# Patient Record
Sex: Female | Born: 1985 | Hispanic: No | Marital: Married | Smoking: Never smoker
Health system: Southern US, Community
[De-identification: ages and names within clinical notes are randomized; demographics above are authoritative.]

## PROBLEM LIST (undated history)

## (undated) DIAGNOSIS — O24419 Gestational diabetes mellitus in pregnancy, unspecified control: Secondary | ICD-10-CM

## (undated) HISTORY — DX: Gestational diabetes mellitus in pregnancy, unspecified control: O24.419

---

## 2008-12-09 ENCOUNTER — Ambulatory Visit (HOSPITAL_COMMUNITY): Admission: RE | Admit: 2008-12-09 | Discharge: 2008-12-09 | Payer: Self-pay | Admitting: Obstetrics and Gynecology

## 2009-01-20 ENCOUNTER — Ambulatory Visit (HOSPITAL_COMMUNITY): Admission: RE | Admit: 2009-01-20 | Discharge: 2009-01-20 | Payer: Self-pay | Admitting: Obstetrics and Gynecology

## 2009-02-17 ENCOUNTER — Ambulatory Visit (HOSPITAL_COMMUNITY): Admission: RE | Admit: 2009-02-17 | Discharge: 2009-02-17 | Payer: Self-pay | Admitting: *Deleted

## 2009-03-18 ENCOUNTER — Ambulatory Visit (HOSPITAL_COMMUNITY): Admission: RE | Admit: 2009-03-18 | Discharge: 2009-03-18 | Payer: Self-pay | Admitting: Obstetrics and Gynecology

## 2010-06-15 IMAGING — US US OB FOLLOW-UP
1 series · 14 of 24 positions shown · non-contrast
Comparison: none

OBSTETRICAL ULTRASOUND:
 This ultrasound was performed in The [HOSPITAL], and the [facility] report will be stored to [REDACTED] PACS.

[Series 1: us ob follow-up · 14 of 24 slices shown]
[im 1/24]
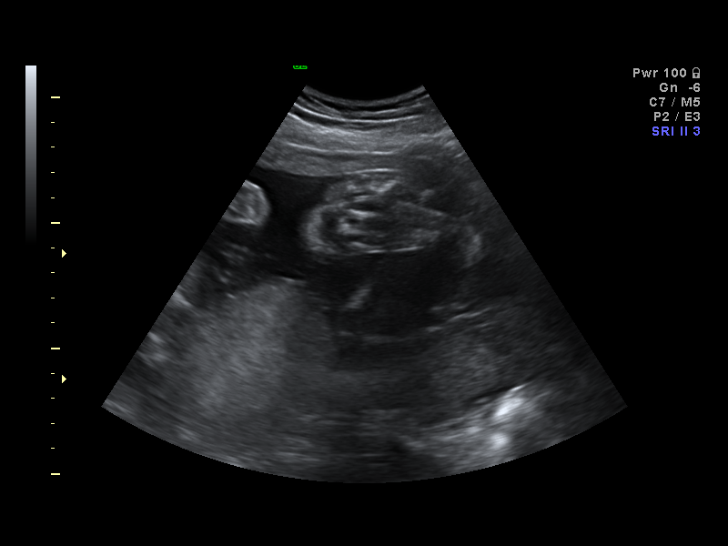
[im 3/24]
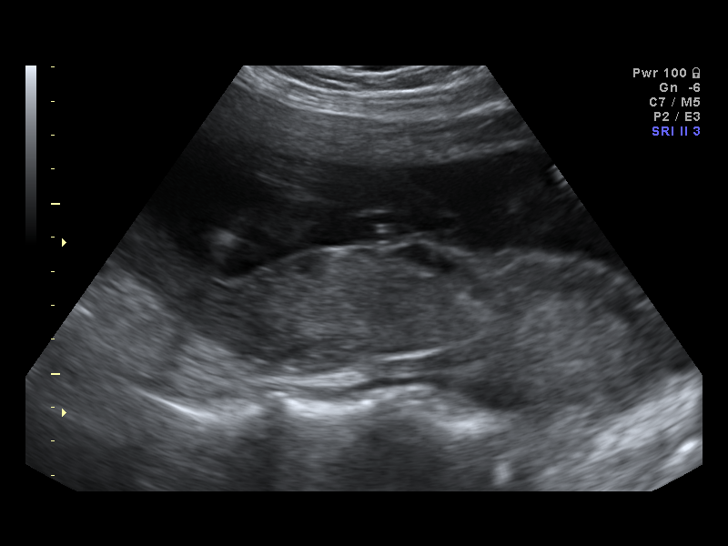
[im 5/24]
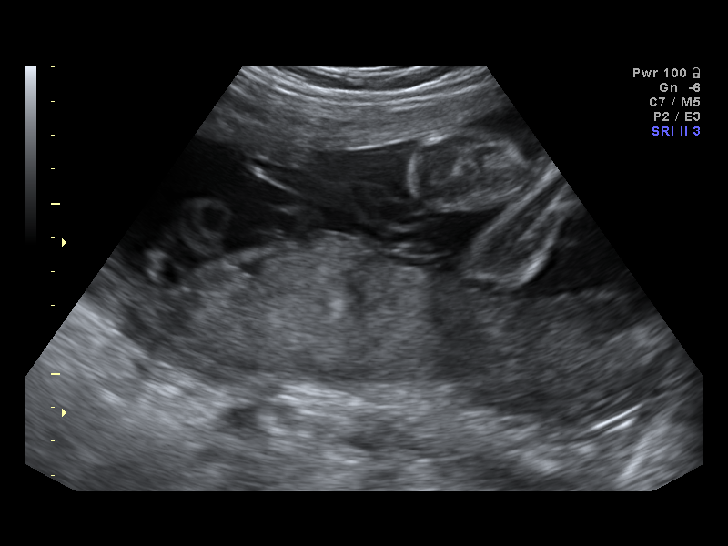
[im 7/24]
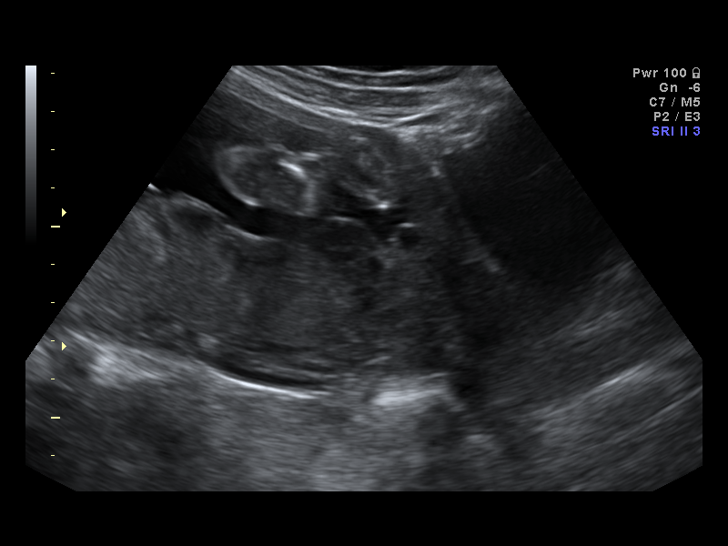
[im 8/24]
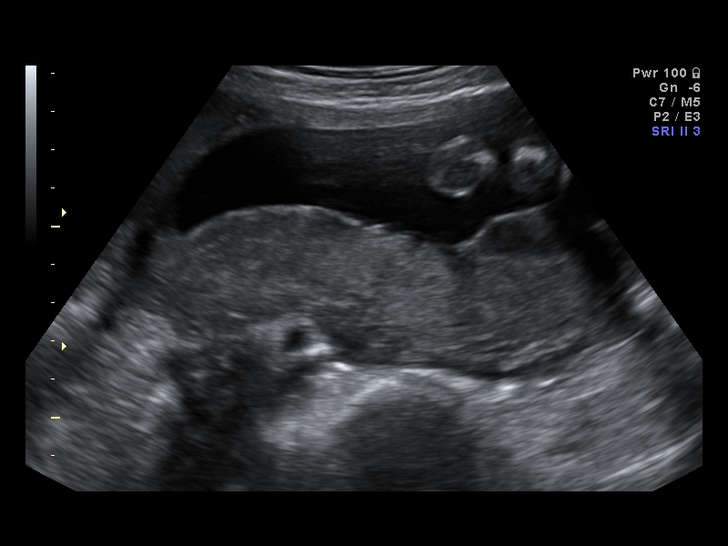
[im 10/24]
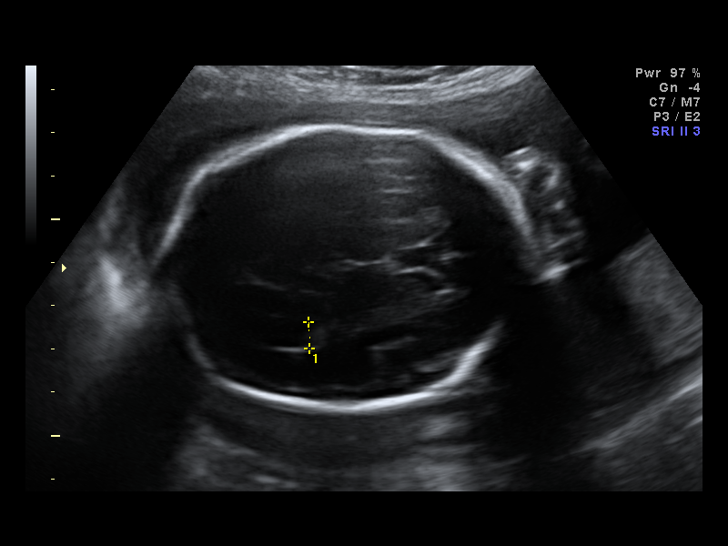
[im 12/24]
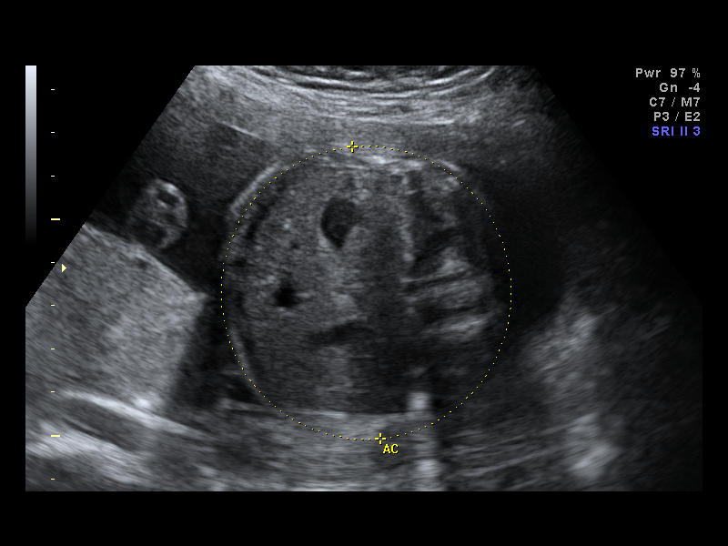
[im 13/24]
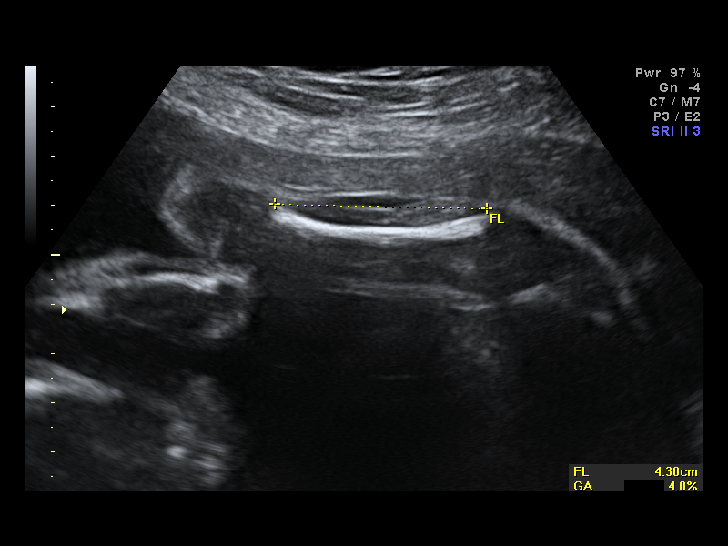
[im 15/24]
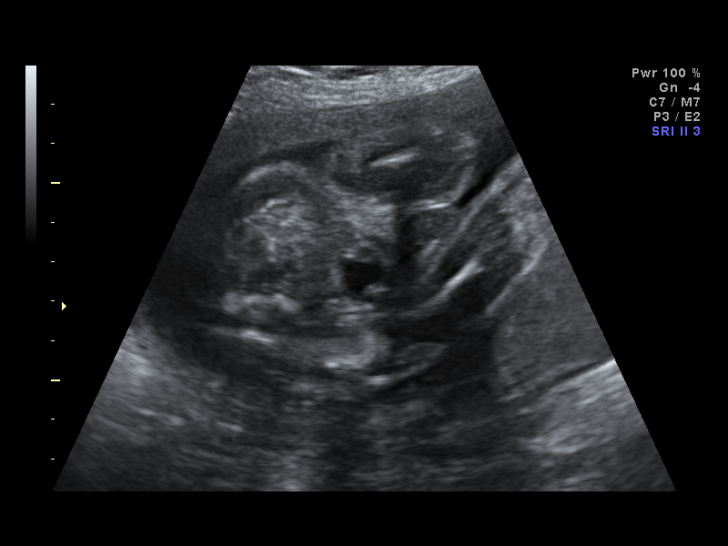
[im 17/24]
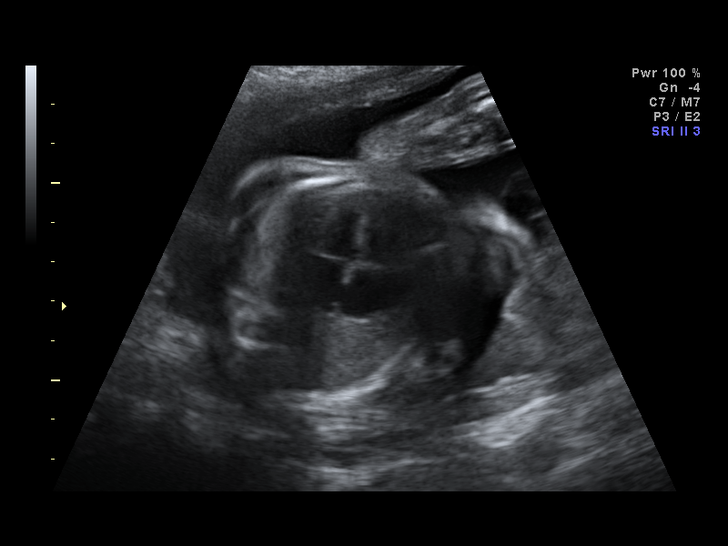
[im 19/24]
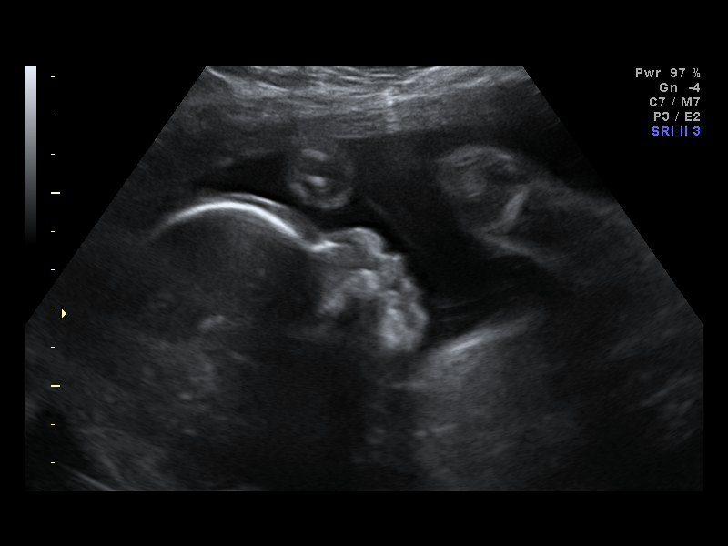
[im 20/24]
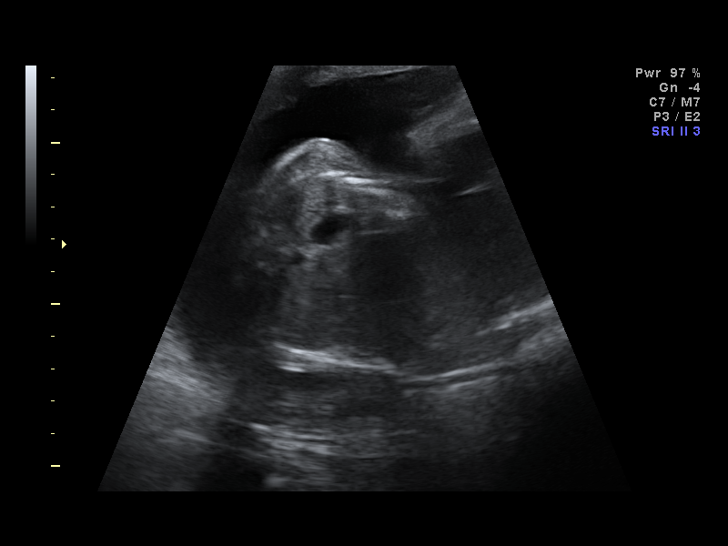
[im 22/24]
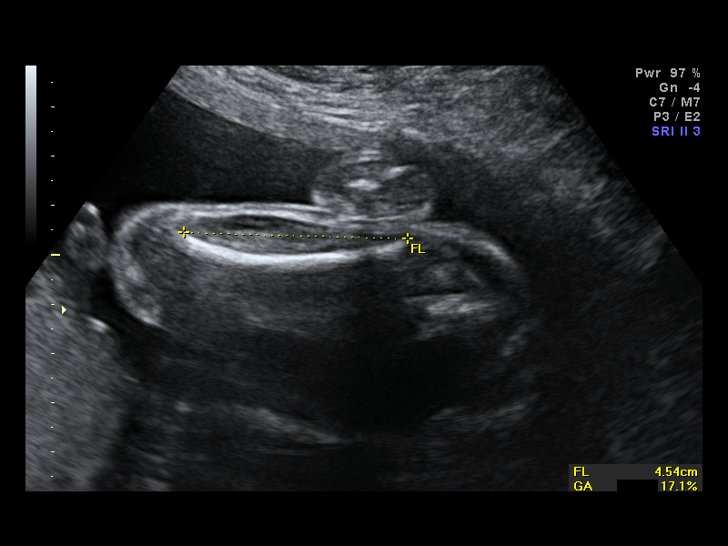
[im 24/24]
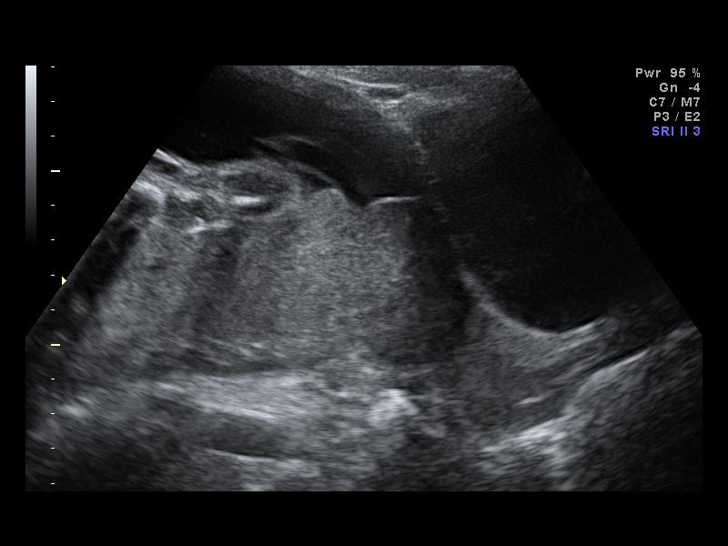

[14 of 24 positions shown; findings below may reference images not displayed]

IMPRESSION: [facility] has also been faxed to the ordering physician.

## 2010-08-11 IMAGING — US US OB FOLLOW-UP
1 series · 14 of 28 positions shown · non-contrast
Comparison: none

OBSTETRICAL ULTRASOUND:
 This ultrasound was performed in The [HOSPITAL], and the [facility] report will be stored to [REDACTED] PACS.

[Series 1: us ob follow-up · 14 of 30 slices shown]
[im 2/30]
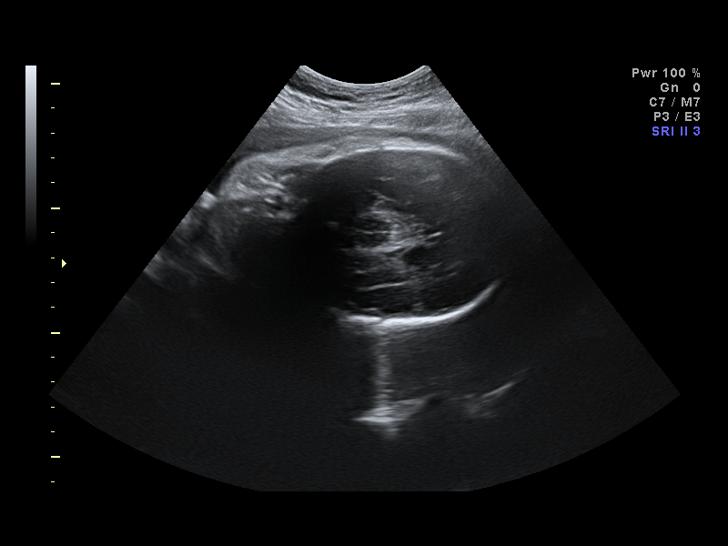
[im 4/30]
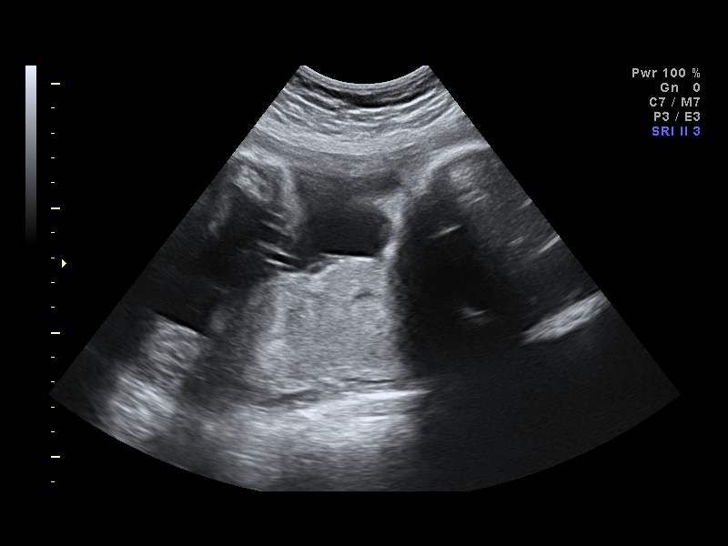
[im 6/30]
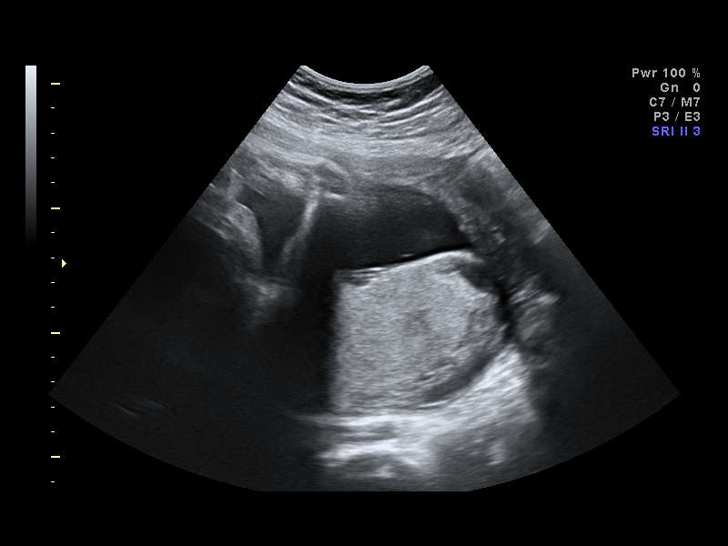
[im 8/30]
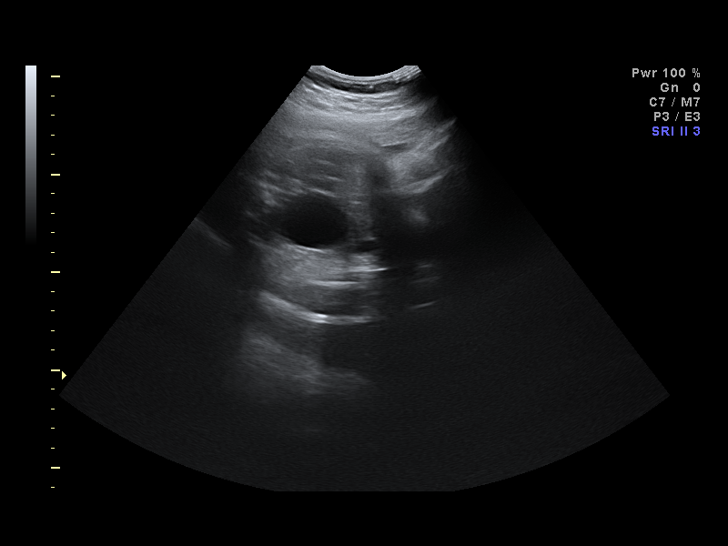
[im 10/30]
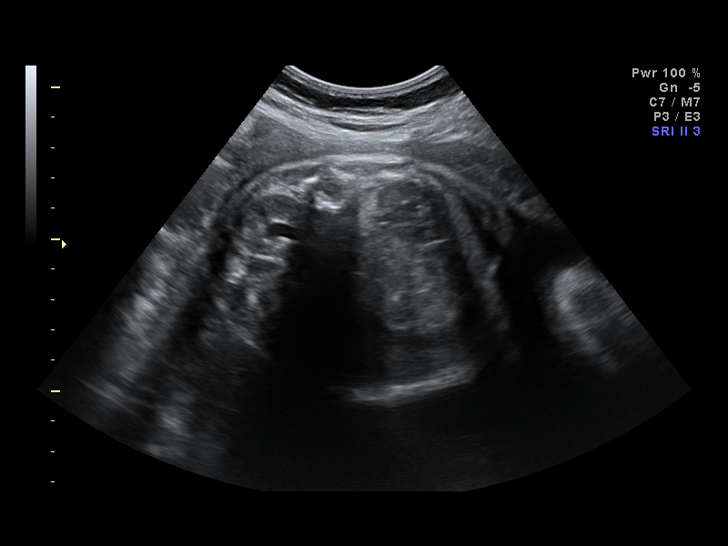
[im 12/30]
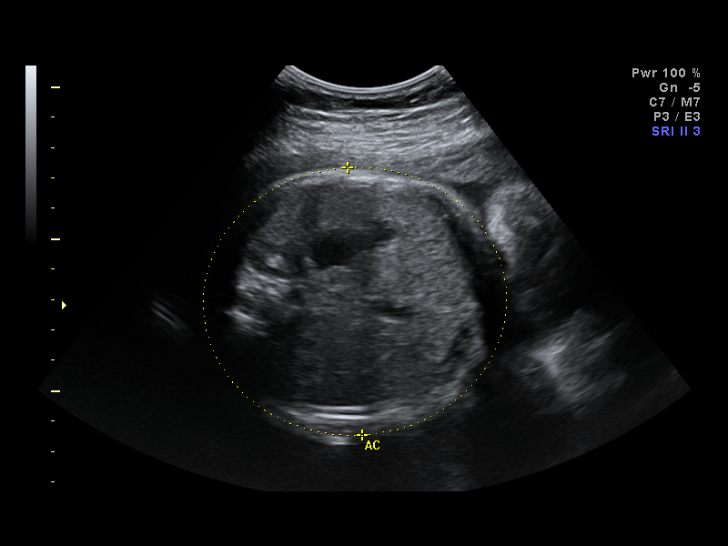
[im 14/30]
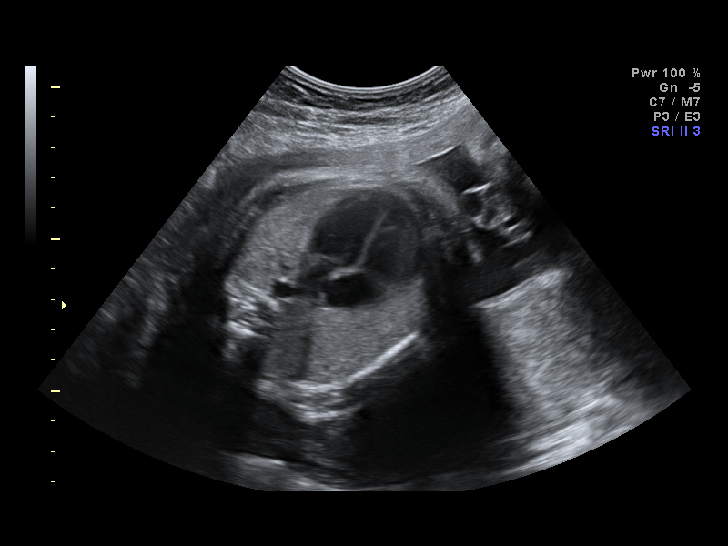
[im 17/30]
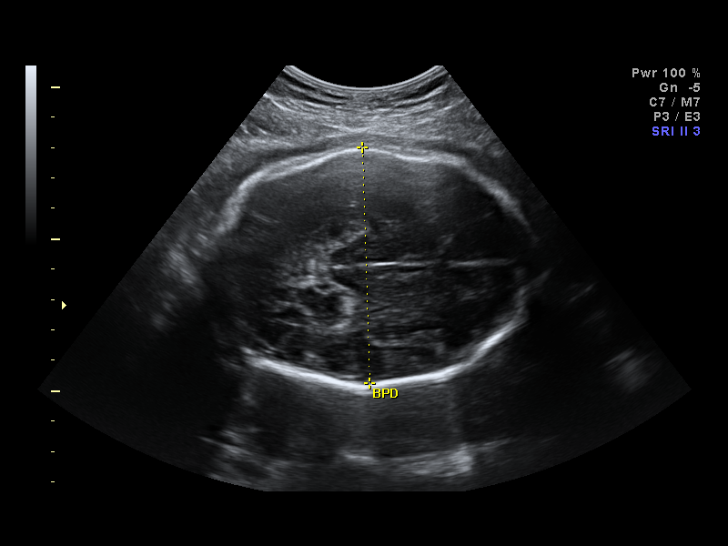
[im 19/30]
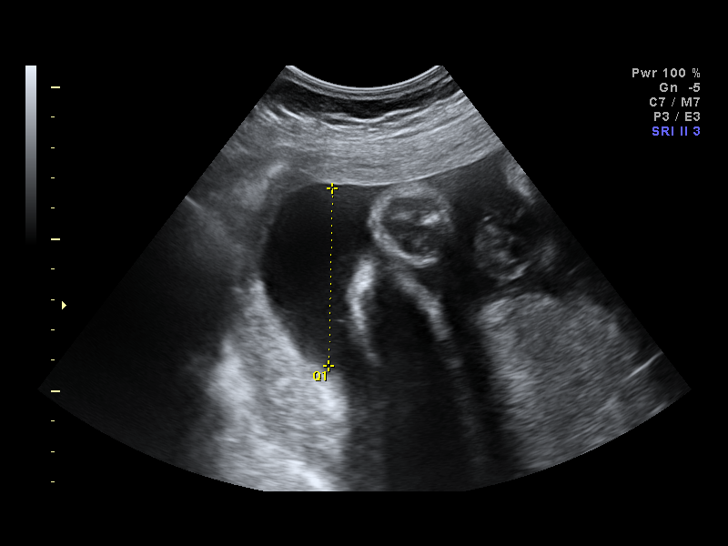
[im 21/30]
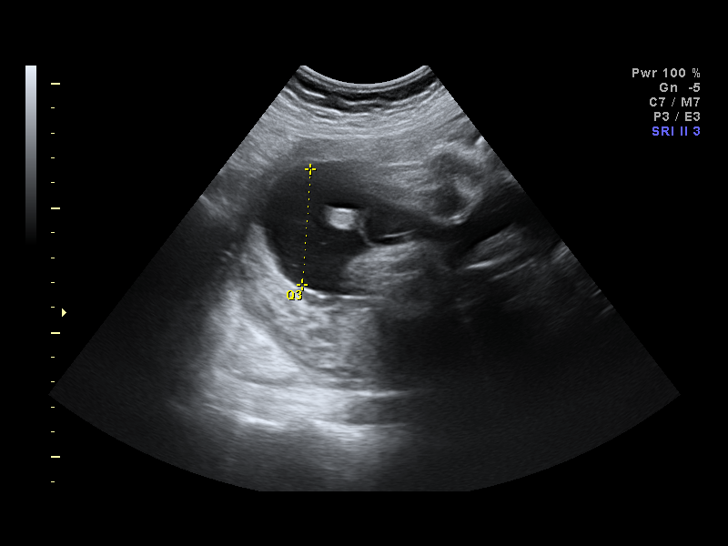
[im 23/30]
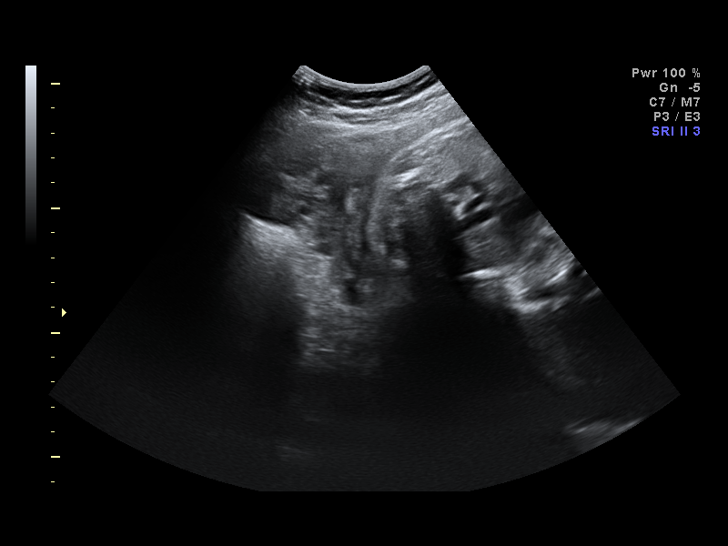
[im 25/30]
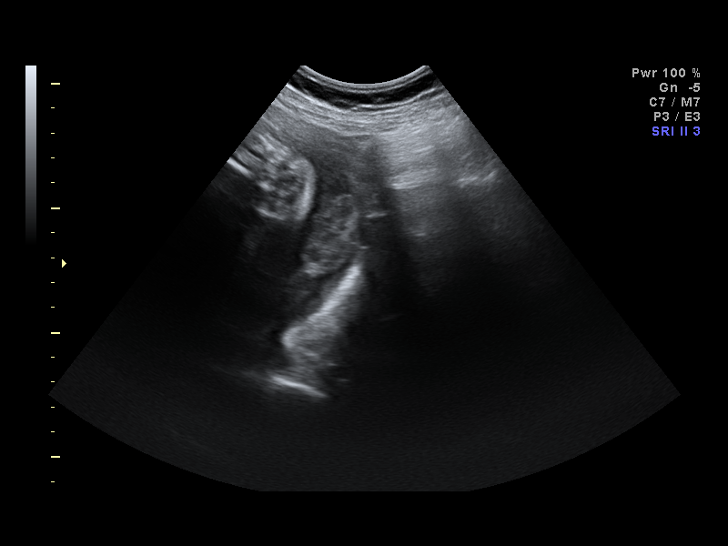
[im 27/30]
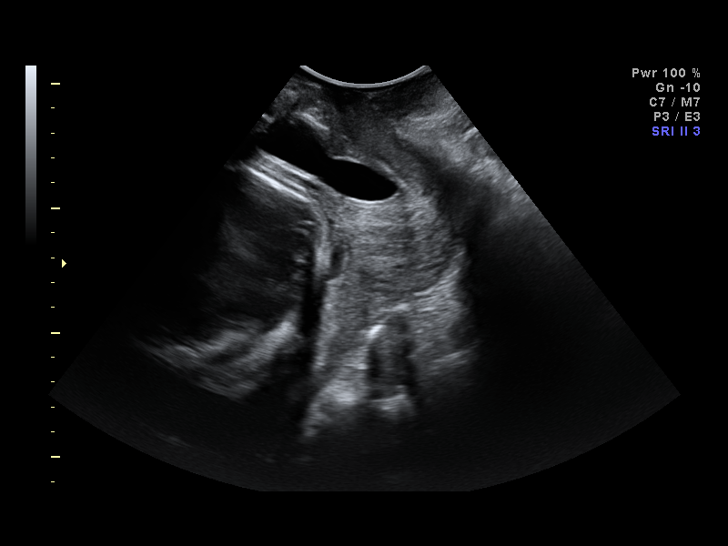
[im 30/30]
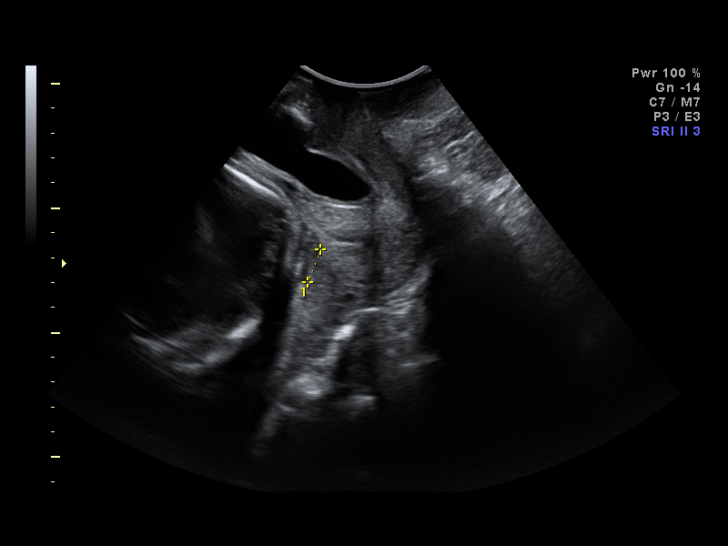

[14 of 28 positions shown; findings below may reference images not displayed]

IMPRESSION: [facility] has also been faxed to the ordering physician.

## 2010-10-05 ENCOUNTER — Encounter: Payer: Self-pay | Admitting: Obstetrics and Gynecology

## 2012-09-13 NOTE — L&D Delivery Note (Signed)
Delivery Note At 11:14 AM a viable female was delivered via VBAC, Spontaneous (Presentation: Middle Occiput Anterior).  APGAR: 5, 9; weight .   Placenta status: Intact, Spontaneous.  Cord: 3 vessels with the following complications: None.   Precipitous delivery. Head delivering on my arrival assisted by Carollee Herter, RN.   Anesthesia:1% Xylocaine local infiltration and Fentanyl 300 mcg IVP for repair.  Episiotomy: None Lacerations: 3rd degree;Perineal Suture Repair: 2.0 3.0 vicryl rapide Est. Blood Loss (mL): 400 CBGs 78-90  Mom to postpartum.  Baby to Couplet care / Skin to Skin.  Balinda Heacock 07/24/2013, 12:06 PM

## 2012-09-13 NOTE — L&D Delivery Note (Signed)
Attestation of Attending Supervision of Advanced Practitioner (PA/CNM/NP): Evaluation and management procedures were performed by the Advanced Practitioner under my supervision and collaboration.  I have reviewed the Advanced Practitioner's note and chart, and I agree with the management and plan.  Evaan Tidwell, MD, FACOG Attending Obstetrician & Gynecologist Faculty Practice, [facility]  

## 2013-02-26 LAB — OB RESULTS CONSOLE RPR
RPR: NONREACTIVE
RPR: NONREACTIVE

## 2013-02-26 LAB — OB RESULTS CONSOLE GC/CHLAMYDIA
Chlamydia: NEGATIVE
Gonorrhea: NEGATIVE

## 2013-02-26 LAB — OB RESULTS CONSOLE ABO/RH: "RH Type ": POSITIVE

## 2013-02-26 LAB — OB RESULTS CONSOLE HEPATITIS B SURFACE ANTIGEN: Hepatitis B Surface Ag: NEGATIVE

## 2013-02-26 LAB — OB RESULTS CONSOLE RUBELLA ANTIBODY, IGM: Rubella: IMMUNE

## 2013-02-26 LAB — OB RESULTS CONSOLE HIV ANTIBODY (ROUTINE TESTING)
HIV: NONREACTIVE
HIV: NONREACTIVE

## 2013-02-26 LAB — OB RESULTS CONSOLE ANTIBODY SCREEN: Antibody Screen: NEGATIVE

## 2013-05-16 ENCOUNTER — Encounter: Payer: Self-pay | Admitting: *Deleted

## 2013-05-21 ENCOUNTER — Encounter: Payer: Medicaid Other | Attending: Family Medicine | Admitting: *Deleted

## 2013-05-21 ENCOUNTER — Ambulatory Visit (INDEPENDENT_AMBULATORY_CARE_PROVIDER_SITE_OTHER): Payer: Medicaid Other | Admitting: Family Medicine

## 2013-05-21 ENCOUNTER — Encounter: Payer: Self-pay | Admitting: Family Medicine

## 2013-05-21 VITALS — BP 116/77 | Temp 97.8°F | Ht 59.0 in | Wt 165.7 lb

## 2013-05-21 DIAGNOSIS — O99810 Abnormal glucose complicating pregnancy: Secondary | ICD-10-CM | POA: Insufficient documentation

## 2013-05-21 DIAGNOSIS — O34219 Maternal care for unspecified type scar from previous cesarean delivery: Secondary | ICD-10-CM | POA: Insufficient documentation

## 2013-05-21 DIAGNOSIS — O24419 Gestational diabetes mellitus in pregnancy, unspecified control: Secondary | ICD-10-CM | POA: Insufficient documentation

## 2013-05-21 DIAGNOSIS — Z713 Dietary counseling and surveillance: Secondary | ICD-10-CM | POA: Insufficient documentation

## 2013-05-21 DIAGNOSIS — Z23 Encounter for immunization: Secondary | ICD-10-CM

## 2013-05-21 DIAGNOSIS — O0990 Supervision of high risk pregnancy, unspecified, unspecified trimester: Secondary | ICD-10-CM

## 2013-05-21 LAB — POCT URINALYSIS DIP (DEVICE)
Ketones, ur: NEGATIVE mg/dL
Protein, ur: NEGATIVE mg/dL
Specific Gravity, Urine: 1.02 (ref 1.005–1.030)
Urobilinogen, UA: 0.2 mg/dL (ref 0.0–1.0)

## 2013-05-21 MED ORDER — TETANUS-DIPHTH-ACELL PERTUSSIS 5-2.5-18.5 LF-MCG/0.5 IM SUSP
0.5000 mL | Freq: Once | INTRAMUSCULAR | Status: AC
  Administered 2013-05-21: 0.5 mL via INTRAMUSCULAR

## 2013-05-21 NOTE — Progress Notes (Signed)
Pulse- 98 New ob packet given Weight gain of 11-20lbs

## 2013-05-21 NOTE — Patient Instructions (Addendum)
Following an appropriate diet and keeping your blood sugar under control is the most important thing to do for your health and that of your unborn baby.  Please check your blood sugar 4 times daily.  Please keep accurate BS logs and bring them with you to every visit.  Please bring your meter also.  Goals for Blood sugar should be: 1.  Fasting (first thing in the morning before eating) should be less than 90.   2.  2 hours after meals should be less than 120.  Please eat 3 meals and 3 snacks.  Include protein (meat, dairy-cheese, eggs, nuts) with all meals.  Be mindful that carbohydrates increase your blood sugar.  Not just sweet food (cookies, cake, donuts, fruit, juice, soda) but also bread, pasta, rice, and potatoes.  You have to limit how many carbs you are eating.  Adding exercise, as little as 30 minutes a day can decrease your blood sugar.  Gestational Diabetes Mellitus Gestational diabetes mellitus, often simply referred to as gestational diabetes, is a type of diabetes that some women develop during pregnancy. In gestational diabetes, the pancreas does not make enough insulin (a hormone), the cells are less responsive to the insulin that is made (insulin resistance), or both.Normally, insulin moves sugars from food into the tissue cells. The tissue cells use the sugars for energy. The lack of insulin or the lack of normal response to insulin causes excess sugars to build up in the blood instead of going into the tissue cells. As a result, high blood sugar (hyperglycemia) develops. The effect of high sugar (glucose) levels can cause many complications.  RISK FACTORS You have an increased chance of developing gestational diabetes if you have a family history of diabetes and also have one or more of the following risk factors:  A body mass index over 30 (obesity).  A previous pregnancy with gestational diabetes.  An older age at the time of pregnancy. If blood glucose levels are kept  in the normal range during pregnancy, women can have a healthy pregnancy. If your blood glucose levels are not well controlled, there may be risks to you, your unborn baby (fetus), your labor and delivery, or your newborn baby.  SYMPTOMS  If symptoms are experienced, they are much like symptoms you would normally expect during pregnancy. The symptoms of gestational diabetes include:   Increased thirst (polydipsia).  Increased urination (polyuria).  Increased urination during the night (nocturia).  Weight loss. This weight loss may be rapid.  Frequent, recurring infections.  Tiredness (fatigue).  Weakness.  Vision changes, such as blurred vision.  Fruity smell to your breath.  Abdominal pain. DIAGNOSIS Diabetes is diagnosed when blood glucose levels are increased. Your blood glucose level may be checked by one or more of the following blood tests:  A fasting blood glucose test. You will not be allowed to eat for at least 8 hours before a blood sample is taken.  A random blood glucose test. Your blood glucose is checked at any time of the day regardless of when you ate.  A hemoglobin A1c blood glucose test. A hemoglobin A1c test provides information about blood glucose control over the previous 3 months.  An oral glucose tolerance test (OGTT). Your blood glucose is measured after you have not eaten (fasted) for 1 3 hours and then after you drink a glucose-containing beverage. Since the hormones that cause insulin resistance are highest at about 24 28 weeks of a pregnancy, an OGTT is usually performed during  that time. If you have risk factors for gestational diabetes, your caregiver may test you for gestational diabetes earlier than 24 weeks of pregnancy. TREATMENT   You will need to take diabetes medicine or insulin daily to keep blood glucose levels in the desired range.  You will need to match insulin dosing with exercise and healthy food choices. The treatment goal is to  maintain the before meal (preprandial), bedtime, and overnight blood glucose level at 60 99 mg/dL during pregnancy. The treatment goal is to further maintain peak after meal blood sugar (postprandial glucose) level at 100 140 mg/dL.  HOME CARE INSTRUCTIONS   Have your hemoglobin A1c level checked twice a year.  Perform daily blood glucose monitoring as directed by your caregiver. It is common to perform frequent blood glucose monitoring.  Monitor urine ketones when you are ill and as directed by your caregiver.  Take your diabetes medicine and insulin as directed by your caregiver to maintain your blood glucose level in the desired range.  Never run out of diabetes medicine or insulin. It is needed every day.  Adjust insulin based on your intake of carbohydrates. Carbohydrates can raise blood glucose levels but need to be included in your diet. Carbohydrates provide vitamins, minerals, and fiber which are an essential part of a healthy diet. Carbohydrates are found in fruits, vegetables, whole grains, dairy products, legumes, and foods containing added sugars.    Eat healthy foods. Alternate 3 meals with 3 snacks.  Maintain a healthy weight gain. The usual total expected weight gain varies according to your prepregnancy body mass index (BMI).  Carry a medical alert card or wear your medical alert jewelry.  Carry a 15 gram carbohydrate snack with you at all times to treat low blood glucose (hypoglycemia). Some examples of 15 gram carbohydrate snacks include:  Glucose tablets, 3 or 4   Glucose gel, 15 gram tube  Raisins, 2 tablespoons (24 g)  Jelly beans, 6  Animal crackers, 8  Fruit juice, regular soda, or low fat milk, 4 ounces (120 mL)  Gummy treats, 9    Recognize hypoglycemia. Hypoglycemia during pregnancy occurs with blood glucose levels of 60 mg/dL and below. The risk for hypoglycemia increases when fasting or skipping meals, during or after intense exercise, and during  sleep. Hypoglycemia symptoms can include:  Tremors or shakes.  Decreased ability to concentrate.  Sweating.  Increased heart rate.  Headache.  Dry mouth.  Hunger.  Irritability.  Anxiety.  Restless sleep.  Altered speech or coordination.  Confusion.  Treat hypoglycemia promptly. If you are alert and able to safely swallow, follow the 15:15 rule:  Take 15 20 grams of rapid-acting glucose or carbohydrate. Rapid-acting options include glucose gel, glucose tablets, or 4 ounces (120 mL) of fruit juice, regular soda, or low fat milk.  Check your blood glucose level 15 minutes after taking the glucose.   Take 15 20 grams more of glucose if the repeat blood glucose level is still 70 mg/dL or below.  Eat a meal or snack within 1 hour once blood glucose levels return to normal.  Be alert to polyuria and polydipsia which are early signs of hyperglycemia. An early awareness of hyperglycemia allows for prompt treatment. Treat hyperglycemia as directed by your caregiver.  Engage in at least 30 minutes of physical activity a day or as directed by your caregiver. Ten minutes of physical activity timed 30 minutes after each meal is encouraged to control postprandial blood glucose levels.  Adjust your insulin  dosing and food intake as needed if you start a new exercise or sport.  Follow your sick day plan at any time you are unable to eat or drink as usual.  Avoid tobacco and alcohol use.  Follow up with your caregiver regularly.  Follow the advice of your caregiver regarding your prenatal and post-delivery (postpartum) appointments, meal planning, exercise, medicines, vitamins, blood tests, other medical tests, and physical activities.  Perform daily skin and foot care. Examine your skin and feet daily for cuts, bruises, redness, nail problems, bleeding, blisters, or sores.  Brush your teeth and gums at least twice a day and floss at least once a day. Follow up with your dentist  regularly.  Schedule an eye exam during the first trimester of your pregnancy or as directed by your caregiver.  Share your diabetes management plan with your workplace or school.  Stay up-to-date with immunizations.  Learn to manage stress.  Obtain ongoing diabetes education and support as needed. SEEK MEDICAL CARE IF:   You are unable to eat food or drink fluids for more than 6 hours.  You have nausea and vomiting for more than 6 hours.  You have a blood glucose level of 200 mg/dL and you have ketones in your urine.  There is a change in mental status.  You develop vision problems.  You have a persistent headache.  You have upper abdominal pain or discomfort.  You develop an additional serious illness.  You have diarrhea for more than 6 hours.  You have been sick or have had a fever for a couple of days and are not getting better. SEEK IMMEDIATE MEDICAL CARE IF:   You have difficulty breathing.  You no longer feel the baby moving.  You are bleeding or have discharge from your vagina.  You start having premature contractions or labor. MAKE SURE YOU:  Understand these instructions.  Will watch your condition.  Will get help right away if you are not doing well or get worse. Document Released: 12/06/2000 Document Revised: 05/24/2012 Document Reviewed: 03/28/2012  [facility] Patient Information 2014 [place], [place].  Pregnancy - Third Trimester The third trimester of pregnancy (the last 3 months) is a period of the most rapid growth for you and your baby. The baby approaches a length of 20 inches and a weight of 6 to 10 pounds. The baby is adding on fat and getting ready for life outside your body. While inside, babies have periods of sleeping and waking, sucking thumbs, and hiccuping. You can often feel small contractions of the uterus. This is false labor. It is also called Braxton-Hicks contractions. This is like a practice for labor. The usual problems in this stage  of pregnancy include more difficulty breathing, swelling of the hands and feet from water retention, and having to urinate more often because of the uterus and baby pressing on your bladder.  PRENATAL EXAMS  Blood work may continue to be done during prenatal exams. These tests are done to check on your health and the probable health of your baby. Blood work is used to follow your blood levels (hemoglobin). Anemia (low hemoglobin) is common during pregnancy. Iron and vitamins are given to help prevent this. You may also continue to be checked for diabetes. Some of the past blood tests may be done again.  The size of the uterus is measured during each visit. This makes sure your baby is growing properly according to your pregnancy dates.  Your blood pressure is checked every prenatal visit.  This is to make sure you are not getting toxemia.  Your urine is checked every prenatal visit for infection, diabetes, and protein.  Your weight is checked at each visit. This is done to make sure gains are happening at the suggested rate and that you and your baby are growing normally.  Sometimes, an ultrasound is performed to confirm the position and the proper growth and development of the baby. This is a test done that bounces harmless sound waves off the baby so your caregiver can more accurately determine a due date.  Discuss the type of pain medicine and anesthesia you will have during your labor and delivery.  Discuss the possibility and anesthesia if a cesarean section might be necessary.  Inform your caregiver if there is any mental or physical violence at home. Sometimes, a specialized non-stress test, contraction stress test, and biophysical profile are done to make sure the baby is not having a problem. Checking the amniotic fluid surrounding the baby is called an amniocentesis. The amniotic fluid is removed by sticking a needle into the belly (abdomen). This is sometimes done near the end of  pregnancy if an early delivery is required. In this case, it is done to help make sure the baby's lungs are mature enough for the baby to live outside of the womb. If the lungs are not mature and it is unsafe to deliver the baby, an injection of cortisone medicine is given to the mother 1 to 2 days before the delivery. This helps the baby's lungs mature and makes it safer to deliver the baby. CHANGES OCCURING IN THE THIRD TRIMESTER OF PREGNANCY Your body goes through many changes during pregnancy. They vary from person to person. Talk to your caregiver about changes you notice and are concerned about.  During the last trimester, you have probably had an increase in your appetite. It is normal to have cravings for certain foods. This varies from person to person and pregnancy to pregnancy.  You may begin to get stretch marks on your hips, abdomen, and breasts. These are normal changes in the body during pregnancy. There are no exercises or medicines to take which prevent this change.  Constipation may be treated with a stool softener or adding bulk to your diet. Drinking lots of fluids, fiber in vegetables, fruits, and whole grains are helpful.  Exercising is also helpful. If you have been very active up until your pregnancy, most of these activities can be continued during your pregnancy. If you have been less active, it is helpful to start an exercise program such as walking. Consult your caregiver before starting exercise programs.  Avoid all smoking, alcohol, non-prescribed drugs, herbs and "street drugs" during your pregnancy. These chemicals affect the formation and growth of the baby. Avoid chemicals throughout the pregnancy to ensure the delivery of a healthy infant.  Backache, varicose veins, and hemorrhoids may develop or get worse.  You will tire more easily in the third trimester, which is normal.  The baby's movements may be stronger and more often.  You may become short of breath  easily.  Your belly button may stick out.  A yellow discharge may leak from your breasts called colostrum.  You may have a bloody mucus discharge. This usually occurs a few days to a week before labor begins. HOME CARE INSTRUCTIONS   Keep your caregiver's appointments. Follow your caregiver's instructions regarding medicine use, exercise, and diet.  During pregnancy, you are providing food for you and your baby.  Continue to eat regular, well-balanced meals. Choose foods such as meat, fish, milk and other low fat dairy products, vegetables, fruits, and whole-grain breads and cereals. Your caregiver will tell you of the ideal weight gain.  A physical sexual relationship may be continued throughout pregnancy if there are no other problems such as early (premature) leaking of amniotic fluid from the membranes, vaginal bleeding, or belly (abdominal) pain.  Exercise regularly if there are no restrictions. Check with your caregiver if you are unsure of the safety of your exercises. Greater weight gain will occur in the last 2 trimesters of pregnancy. Exercising helps:  Control your weight.  Get you in shape for labor and delivery.  You lose weight after you deliver.  Rest a lot with legs elevated, or as needed for leg cramps or low back pain.  Wear a good support or jogging bra for breast tenderness during pregnancy. This may help if worn during sleep. Pads or tissues may be used in the bra if you are leaking colostrum.  Do not use hot tubs, steam rooms, or saunas.  Wear your seat belt when driving. This protects you and your baby if you are in an accident.  Avoid raw meat, cat litter boxes and soil used by cats. These carry germs that can cause birth defects in the baby.  It is easier to leak urine during pregnancy. Tightening up and strengthening the pelvic muscles will help with this problem. You can practice stopping your urination while you are going to the bathroom. These are the same  muscles you need to strengthen. It is also the muscles you would use if you were trying to stop from passing gas. You can practice tightening these muscles up 10 times a set and repeating this about 3 times per day. Once you know what muscles to tighten up, do not perform these exercises during urination. It is more likely to cause an infection by backing up the urine.  Ask for help if you have financial, counseling, or nutritional needs during pregnancy. Your caregiver will be able to offer counseling for these needs as well as refer you for other special needs.  Make a list of emergency phone numbers and have them available.  Plan on getting help from family or friends when you go home from the hospital.  Make a trial run to the hospital.  Take prenatal classes with the father to understand, practice, and ask questions about the labor and delivery.  Prepare the baby's room or nursery.  Do not travel out of the city unless it is absolutely necessary and with the advice of your caregiver.  Wear only low or no heal shoes to have better balance and prevent falling. MEDICINES AND DRUG USE IN PREGNANCY  Take prenatal vitamins as directed. The vitamin should contain 1 milligram of folic acid. Keep all vitamins out of reach of children. Only a couple vitamins or tablets containing iron may be fatal to a baby or young child when ingested.  Avoid use of all medicines, including herbs, over-the-counter medicines, not prescribed or suggested by your caregiver. Only take over-the-counter or prescription medicines for pain, discomfort, or fever as directed by your caregiver. Do not use aspirin, ibuprofen or naproxen unless approved by your caregiver.  Let your caregiver also know about herbs you may be using.  Alcohol is related to a number of birth defects. This includes fetal alcohol syndrome. All alcohol, in any form, should be avoided completely. Smoking will cause low birth  rate and premature  babies.  Illegal drugs are very harmful to the baby. They are absolutely forbidden. A baby born to an addicted mother will be addicted at birth. The baby will go through the same withdrawal an adult does. SEEK MEDICAL CARE IF: You have any concerns or worries during your pregnancy. It is better to call with your questions if you feel they cannot wait, rather than worry about them. SEEK IMMEDIATE MEDICAL CARE IF:   An unexplained oral temperature above 102 F (38.9 C) develops, or as your caregiver suggests.  You have leaking of fluid from the vagina. If leaking membranes are suspected, take your temperature and tell your caregiver of this when you call.  There is vaginal spotting, bleeding or passing clots. Tell your caregiver of the amount and how many pads are used.  You develop a bad smelling vaginal discharge with a change in the color from clear to white.  You develop vomiting that lasts more than 24 hours.  You develop chills or fever.  You develop shortness of breath.  You develop burning on urination.  You loose more than 2 pounds of weight or gain more than 2 pounds of weight or as suggested by your caregiver.  You notice sudden swelling of your face, hands, and feet or legs.  You develop belly (abdominal) pain. Round ligament discomfort is a common non-cancerous (benign) cause of abdominal pain in pregnancy. Your caregiver still must evaluate you.  You develop a severe headache that does not go away.  You develop visual problems, blurred or double vision.  If you have not felt your baby move for more than 1 hour. If you think the baby is not moving as much as usual, eat something with sugar in it and lie down on your left side for an hour. The baby should move at least 4 to 5 times per hour. Call right away if your baby moves less than that.  You fall, are in a car accident, or any kind of trauma.  There is mental or physical violence at home. Document Released:  08/24/2001 Document Revised: 05/24/2012 Document Reviewed: 02/26/2009 [facility] Patient Information 2014 [place], [place].  Breastfeeding A change in hormones during your pregnancy causes growth of your breast tissue and an increase in number and size of milk ducts. The hormone prolactin allows proteins, sugars, and fats from your blood supply to make breast milk in your milk-producing glands. The hormone progesterone prevents breast milk from being released before the birth of your baby. After the birth of your baby, your progesterone level decreases allowing breast milk to be released. Thoughts of your baby, as well as his or her sucking or crying, can stimulate the release of milk from the milk-producing glands. Deciding to breastfeed (nurse) is one of the best choices you can make for you and your baby. The information that follows gives a brief review of the benefits, as well as other important skills to know about breastfeeding. BENEFITS OF BREASTFEEDING For your baby  The first milk (colostrum) helps your baby's digestive system function better.   There are antibodies in your milk that help your baby fight off infections.   Your baby has a lower incidence of asthma, allergies, and sudden infant death syndrome (SIDS).   The nutrients in breast milk are better for your baby than infant formulas.  Breast milk improves your baby's brain development.   Your baby will have less gas, colic, and constipation.  Your baby is less likely  to develop other conditions, such as childhood obesity, asthma, or diabetes mellitus. For you  Breastfeeding helps develop a very special bond between you and your baby.   Breastfeeding is convenient, always available at the correct temperature, and costs nothing.   Breastfeeding helps to burn calories and helps you lose the weight gained during pregnancy.   Breastfeeding makes your uterus contract back down to normal size faster and slows bleeding  following delivery.   Breastfeeding mothers have a lower risk of developing osteoporosis or breast or ovarian cancer later in life.  BREASTFEEDING FREQUENCY  A healthy, full-term baby may breastfeed as often as every hour or space his or her feedings to every 3 hours. Breastfeeding frequency will vary from baby to baby.   Newborns should be fed no less than every 2 3 hours during the day and every 4 5 hours during the night. You should breastfeed a minimum of 8 feedings in a 24 hour period.  Awaken your baby to breastfeed if it has been 3 4 hours since the last feeding.  Breastfeed when you feel the need to reduce the fullness of your breasts or when your newborn shows signs of hunger. Signs that your baby may be hungry include:  Increased alertness or activity.  Stretching.  Movement of the head from side to side.  Movement of the head and opening of the mouth when the corner of the mouth or cheek is stroked (rooting).  Increased sucking sounds, smacking lips, cooing, sighing, or squeaking.  Hand-to-mouth movements.  Increased sucking of fingers or hands.  Fussing.  Intermittent crying.  Signs of extreme hunger will require calming and consoling before you try to feed your baby. Signs of extreme hunger may include:  Restlessness.  A loud, strong cry.  Screaming.  Frequent feeding will help you make more milk and will help prevent problems, such as sore nipples and engorgement of the breasts.  BREASTFEEDING   Whether lying down or sitting, be sure that the baby's abdomen is facing your abdomen.   Support your breast with 4 fingers under your breast and your thumb above your nipple. Make sure your fingers are well away from your nipple and your baby's mouth.   Stroke your baby's lips gently with your finger or nipple.   When your baby's mouth is open wide enough, place all of your nipple and as much of the colored area around your nipple (areola) as possible into  your baby's mouth.  More areola should be visible above his or her upper lip than below his or her lower lip.  Your baby's tongue should be between his or her lower gum and your breast.  Ensure that your baby's mouth is correctly positioned around the nipple (latched). Your baby's lips should create a seal on your breast.  Signs that your baby has effectively latched onto your nipple include:  Tugging or sucking without pain.  Swallowing heard between sucks.  Absent click or smacking sound.  Muscle movement above and in front of his or her ears with sucking.  Your baby must suck about 2 3 minutes in order to get your milk. Allow your baby to feed on each breast as long as he or she wants. Nurse your baby until he or she unlatches or falls asleep at the first breast, then offer the second breast.  Signs that your baby is full and satisfied include:  A gradual decrease in the number of sucks or complete cessation of sucking.  Falling  asleep.  Extension or relaxation of his or her body.  Retention of a small amount of milk in his or her mouth.  Letting go of your breast by himself or herself.  Signs of effective breastfeeding in you include:  Breasts that have increased firmness, weight, and size prior to feeding.  Breasts that are softer after nursing.  Increased milk volume, as well as a change in milk consistency and color by the 5th day of breastfeeding.  Breast fullness relieved by breastfeeding.  Nipples are not sore, cracked, or bleeding.  If needed, break the suction by putting your finger into the corner of your baby's mouth and sliding your finger between his or her gums. Then, remove your breast from his or her mouth.  It is common for babies to spit up a small amount after a feeding.  Babies often swallow air during feeding. This can make babies fussy. Burping your baby between breasts can help with this.  Vitamin D supplements are recommended for babies who  get only breast milk.  Avoid using a pacifier during your baby's first 4 6 weeks.  Avoid supplemental feedings of water, formula, or juice in place of breastfeeding. Breast milk is all the food your baby needs. It is not necessary for your baby to have water or formula. Your breasts will make more milk if supplemental feedings are avoided during the early weeks. HOW TO TELL WHETHER YOUR BABY IS GETTING ENOUGH BREAST MILK Wondering whether or not your baby is getting enough milk is a common concern among mothers. You can be assured that your baby is getting enough milk if:   Your baby is actively sucking and you hear swallowing.   Your baby seems relaxed and satisfied after a feeding.   Your baby nurses at least 8 12 times in a 24 hour time period.  During the first 81 45 days of age:  Your baby is wetting at least 3 5 diapers in a 24 hour period. The urine should be clear and pale yellow.  Your baby is having at least 3 4 stools in a 24 hour period. The stool should be soft and yellow.  At 86 47 days of age, your baby is having at least 3 6 stools in a 24 hour period. The stool should be seedy and yellow by 71 days of age.  Your baby has a weight loss less than 7 10% during the first 57 days of age.  Your baby does not lose weight after 6 54 days of age.  Your baby gains 4 7 ounces each week after he or she is 79 days of age.  Your baby gains weight by 73 days of age and is back to birth weight within 2 weeks. ENGORGEMENT In the first week after your baby is born, you may experience extremely full breasts (engorgement). When engorged, your breasts may feel heavy, warm, or tender to the touch. Engorgement peaks within 24 48 hours after delivery of your baby.  Engorgement may be reduced by:  Continuing to breastfeed.  Increasing the frequency of breastfeeding.  Taking warm showers or applying warm, moist heat to your breasts just before each feeding. This increases circulation and helps  the milk flow.   Gently massaging your breast before and during the feedings. With your fingertips, massage from your chest wall towards your nipple in a circular motion.   Ensuring that your baby empties at least one breast at every feeding. It also helps to start the  next feeding on the opposite breast.   Expressing breast milk by hand or by using a breast pump to empty the breasts if your baby is sleepy, or not nursing well. You may also want to express milk if you are returning to work oryou feel you are getting engorged.  Ensuring your baby is latched on and positioned properly while breastfeeding. If you follow these suggestions, your engorgement should improve in 24 48 hours. If you are still experiencing difficulty, call your lactation consultant or caregiver.  CARING FOR YOURSELF Take care of your breasts.  Bathe or shower daily.   Avoid using soap on your nipples.   Wear a supportive bra. Avoid wearing underwire style bras.  Air dry your nipples for a 3 after each feeding.   Use only cotton bra pads to absorb breast milk leakage. Leaking of breast milk between feedings is normal.   Use only pure lanolin on your nipples after nursing. You do not need to wash it off before feeding your baby again. Another option is to express a few drops of breast milk and gently massage that milk into your nipples.  Continue breast self-awareness checks. Take care of yourself.  Eat healthy foods. Alternate 3 meals with 3 snacks.  Avoid foods that you notice affect your baby in a bad way.  Drink milk, fruit juice, and water to satisfy your thirst (about 8 glasses a day).   Rest often, relax, and take your prenatal vitamins to prevent fatigue, stress, and anemia.  Avoid chewing and smoking tobacco.  Avoid alcohol and drug use.  Take over-the-counter and prescribed medicine only as directed by your caregiver or pharmacist. You should always check with your caregiver or  pharmacist before taking any new medicine, vitamin, or herbal supplement.  Know that pregnancy is possible while breastfeeding. If desired, talk to your caregiver about family planning and safe birth control methods that may be used while breastfeeding. SEEK MEDICAL CARE IF:   You feel like you want to stop breastfeeding or have become frustrated with breastfeeding.  You have painful breasts or nipples.  Your nipples are cracked or bleeding.  Your breasts are red, tender, or warm.  You have a swollen area on either breast.  You have a fever or chills.  You have nausea or vomiting.  You have drainage from your nipples.  Your breasts do not become full before feedings by the 5th day after delivery.  You feel sad and depressed.  Your baby is too sleepy to eat well.  Your baby is having trouble sleeping.   Your baby is wetting less than 3 diapers in a 24 hour period.  Your baby has less than 3 stools in a 24 hour period.  Your baby's skin or the white part of his or her eyes becomes more yellow.   Your baby is not gaining weight by 84 days of age. MAKE SURE YOU:   Understand these instructions.  Will watch your condition.  Will get help right away if you are not doing well or get worse. Document Released: 08/30/2005 Document Revised: 05/24/2012 Document Reviewed: 04/05/2012 [facility] Patient Information 2014 [place], [place]. ?            ?    ?     ?        ?   ? .   4     ?  ? ?.    BS            ?. ?   ?  ?.    ?      :  1.  (    ?   ? ?)   90  .     2   2. 120    .   3   3 ?  ?.     ? ( ?? ?  ?  ?) .   ??     ?? ?     .   ??  (? ?    )  ?    .    ?     carbs   ? .         30  ?         ?     ?.    ?? mellitus     ??      ?? mellitus  ?    ? ?? ? ?  .  ?? ?  ? ? (? ) ?  ? ? ?   ? ( ) ?       ?.    ? ? ? ?     . ? ? ?     . ? ? ? ? ?    ? ? ?  ? ? ?    ? ?    ?     . ?    ?  ?? (hyperglycemia) ?. ? ?? () ?      ??? ?  .         ??  ?  ? ?    ?    ?  ? ?   ?      ?? ? ?  ?    :  30 ()  ?   ?.   ??   ?  .     ? ?  .   ?  ?       ?  ?    ? ?      ?.    ?  ?   ?   ?   ?      ?   ?  (?)  ?   ? ?   .         ?            ?  ?  ? ?.  ?? ?  ?  ?:   ? (polydipsia).  ? ?  (polyuria).   (nocturia)    ?.   ? ?. ?  ? ? ?    .      ?.   ().  ?.  ?     ??.  ?    .  ? ? .  ?   ?  ?  ?    ?  ?? ? ?   .   ?  ?   ? ? ?   ?   ?  ? ?     ?   :  ?   ?  ?.   ?    ?         8       ?  ? ?.  ?  ?   ?  ?.   ?      ?    ? ?  ?   ? .  ? ? A1C  ?  ?. ? ? A1C ?  3     ?     ?    .  ? ?  ? ? (  OGTT).  ? ?       ?  ? ? 1 3    ()     ?     ?  . ? ?  ?       ? 24 28  ?   ? ? ? OGTT        ?   ? .   ??      ?   ? ?   ??    24      ?   ?.        ?  ?  ?        ?? ? ? ? ?   ?   ?.            ?  dosing     ?   ?.     60 99    (preprandial)      ?  ?    / ??        .    ?    ?  (   ) 100 140  / ?? ?    ?     .   ? ?  ?   ? A1C   ?  ?     ? .    ?    ?   ?     ?  ?  ? ? . ?    ?  ? ?  ?    .    ? ?      ?  ?   ?   ?  ? ketones  ?.   ? ?   ?  ?        ? ?  ?   ?     ?? ?   ?  .  ?? ?  ? ?    ? ?. ?   ?  .  ?? ?  ?  ? ?  ?  ?.  ?  ?    ?     ?? ?  ?    . ?? ?     ?   ?  ?  ?  ?   ?. ??  ?    ?  ?      ? ? ?   ?.      ? ? . 3    3   .  ?      .      prepregnancy ?  ? (??)    ? .  ? ?    ?  ? ?  ? .     ( hypoglycemia)          ? 15  ??  ??. 15  ?? ? ?   ?  ?:   ? ? 3 ? 4    ? 15  ?   2  (24 G)  ?? ? 6   ? 8       ?  ?  4  (120

## 2013-05-21 NOTE — Progress Notes (Signed)
NEW OB transfer from [facility] for GDM--discussed diet, BS checks--info given in [place] and [place] Risks of DM also discussed.

## 2013-05-21 NOTE — Progress Notes (Signed)
GDM Education: 29wks, new diagnosis GDM. Comes with husband and interpreter. Basic Diabetes education provided. True Track Glucometer dispensed with teach back. Dispensed TT Glucometer, 1bx lancets, 1bx . Instructed to test 4 times daily.

## 2013-05-21 NOTE — Progress Notes (Signed)
Nutrition note: 1st visit consult and GDM diet education Pt has a h/o obesity and is a newly diagnosed GDM pt. Pt has gained 4.7# @ [redacted]w[redacted]d, which is < expected. Pt reports eating 3-4x/d. Pt is taking PNV. Pt reports no N/V or heartburn. Pt received verbal & written education via interpreter about GDM diet.  Discussed wt gain goals of 11-20# or 0.5#/wk. Pt agrees to continue taking PNV and follow GDM diet with 3 meals & 3 snacks/d with proper CHO/ protein combination.  Pt has WIC and plans to BF. F/u in 2-4 wks Blondell Reveal, MS, RD, LDN

## 2013-05-24 ENCOUNTER — Encounter: Payer: Self-pay | Admitting: *Deleted

## 2013-05-28 ENCOUNTER — Ambulatory Visit (INDEPENDENT_AMBULATORY_CARE_PROVIDER_SITE_OTHER): Payer: Medicaid Other | Admitting: Obstetrics & Gynecology

## 2013-05-28 ENCOUNTER — Encounter: Payer: Self-pay | Admitting: *Deleted

## 2013-05-28 VITALS — BP 122/77 | Wt 164.1 lb

## 2013-05-28 DIAGNOSIS — O99810 Abnormal glucose complicating pregnancy: Secondary | ICD-10-CM

## 2013-05-28 DIAGNOSIS — O0990 Supervision of high risk pregnancy, unspecified, unspecified trimester: Secondary | ICD-10-CM

## 2013-05-28 LAB — POCT URINALYSIS DIP (DEVICE)
Bilirubin Urine: NEGATIVE
Nitrite: NEGATIVE
Specific Gravity, Urine: <=1.005 (ref 1.005–1.030)
Urobilinogen, UA: 0.2 mg/dL (ref 0.0–1.0)
pH: 6 (ref 5.0–8.0)

## 2013-05-28 MED ORDER — TRUEPLUS LANCETS 30G MISC: 1.0000 | Freq: Four times a day (QID) | Status: DC

## 2013-05-28 MED ORDER — GLUCOSE BLOOD VI STRP: ORAL_STRIP | Status: DC

## 2013-05-28 NOTE — Progress Notes (Signed)
Pulse: 106

## 2013-05-28 NOTE — Progress Notes (Signed)
Nutrition note: f/u re GDM diet Pt has gained 3.1# @ [redacted]w[redacted]d, which is < expected and pt lost 1.5# since last week. Pt stated she has decreased her eating this past week due to GDM and has been stressed about having DM post delivery.  Pt's BS range: fasting-87-101 & 2hr pp-79-142 Pt stated she is having a piece of fruit after dinner. Pt is walking after breakfast, and her BS after breakfast have been <120.  Reviewed GDM diet and encouraged pt to include a protein source with all meals and snacks. Also encouraged pt to add a walk after dinner, to help lower her 2hr pp, which have been >120 except 1x. Pt agrees to continue drinking water throughout the day, add a walk after dinner and ensure she eats a protein source with meals & snacks. F/u in 2-4 wks Blondell Reveal, MS, RD, LDN

## 2013-05-28 NOTE — Progress Notes (Signed)
Pt transferred from [facility] for GDM.  CBG following diet 98,93,95,101,87,92,100  2hr after breakfast 85,94,86,81,79,79,104  2 hr after lunch 135,116,98,116,113,123  2 hr after dinner 124,132,141,100,135,136,142.  When pt eats the snack it is fruit only.  Will send back to nutritionist to review diet.   Will have pt return in one week to eval CBGs. Pt wants to transfer back to [facility] due to distance.  Urine not available at time of visit.

## 2013-05-29 ENCOUNTER — Encounter: Payer: Self-pay | Admitting: *Deleted

## 2013-05-30 ENCOUNTER — Encounter: Payer: Self-pay | Admitting: *Deleted

## 2013-06-04 ENCOUNTER — Ambulatory Visit (INDEPENDENT_AMBULATORY_CARE_PROVIDER_SITE_OTHER): Payer: Medicaid Other | Admitting: Obstetrics & Gynecology

## 2013-06-04 VITALS — BP 115/72 | Wt 162.8 lb

## 2013-06-04 DIAGNOSIS — Z609 Problem related to social environment, unspecified: Secondary | ICD-10-CM

## 2013-06-04 DIAGNOSIS — O0990 Supervision of high risk pregnancy, unspecified, unspecified trimester: Secondary | ICD-10-CM

## 2013-06-04 DIAGNOSIS — O34219 Maternal care for unspecified type scar from previous cesarean delivery: Secondary | ICD-10-CM

## 2013-06-04 DIAGNOSIS — O99810 Abnormal glucose complicating pregnancy: Secondary | ICD-10-CM

## 2013-06-04 DIAGNOSIS — Z789 Other specified health status: Secondary | ICD-10-CM | POA: Insufficient documentation

## 2013-06-04 LAB — POCT URINALYSIS DIP (DEVICE)
Hgb urine dipstick: NEGATIVE
Protein, ur: NEGATIVE mg/dL
pH: 7 (ref 5.0–8.0)

## 2013-06-04 MED ORDER — ACCU-CHEK NANO SMARTVIEW W/DEVICE KIT: 1.0000 | PACK | Status: DC

## 2013-06-04 MED ORDER — GLUCOSE BLOOD VI STRP: ORAL_STRIP | Status: DC

## 2013-06-04 MED ORDER — ACCU-CHEK FASTCLIX LANCETS MISC: 1.0000 [IU] | Freq: Four times a day (QID) | Status: DC

## 2013-06-04 NOTE — Progress Notes (Signed)
Pulse: 82

## 2013-06-04 NOTE — Progress Notes (Signed)
[place] interpreter present.  On BS review, only one abnormal PP, rest are within range. Continue diet control.  Prescribed Accuchek meter and supplies, patient now has Medicaid.  No other complaints or concerns.  Fetal movement and labor precautions reviewed

## 2013-06-04 NOTE — Patient Instructions (Signed)
Return to clinic for any obstetric concerns or go to [facility] for evaluation  

## 2013-06-18 ENCOUNTER — Ambulatory Visit (INDEPENDENT_AMBULATORY_CARE_PROVIDER_SITE_OTHER): Payer: Medicaid Other | Admitting: Family Medicine

## 2013-06-18 ENCOUNTER — Encounter: Payer: Self-pay | Admitting: Family Medicine

## 2013-06-18 VITALS — BP 112/72 | Temp 97.6°F | Wt 162.1 lb

## 2013-06-18 DIAGNOSIS — Z23 Encounter for immunization: Secondary | ICD-10-CM

## 2013-06-18 DIAGNOSIS — O34219 Maternal care for unspecified type scar from previous cesarean delivery: Secondary | ICD-10-CM

## 2013-06-18 DIAGNOSIS — O99810 Abnormal glucose complicating pregnancy: Secondary | ICD-10-CM

## 2013-06-18 NOTE — Patient Instructions (Addendum)
.           ()        ( )  .          .       .                   .       (  ) .     ()       .                           :       30 ().      .      .                      .                    ()       .                    .      :    ().    ().      (   ).   .      .    .   ().  .       .     .    Marland Kitchen              .               :       .       8       .      Marland Kitchen                .   A1C      .    A1C         3  .        (OGTT).             ()  1 3         .          24 28        OGTT   .                       24  .  :                   .             .         ( )           60 99  /    .            (  )   100 ??140  / .    CARE     A1C     .             .         .            .                  .       .    .        .                .             Marland Kitchen                Marland Kitchen     Marland Kitchen  3   3  .      .               (BMI).             .       15           (hypoglycemia).    15      :     3  4      15    2    (24 )    6    8          4   (120 )    9       .               60  /  .                    .        :    .     .  .     .  .   .  .  .  .    .     .  .       .            15:15:   15 20       .          4  (120 )         .        15   .    15 20               70  /   .         1           .               .          .          .        30           .        30             .               .                 .     .       .                ()             .      Marland Kitchen              .               .     Marland Kitchen                .             .        .     .         .     :             6 .       6 .       200  /      .      .      .    .         .       Marland Kitchen  6 .            .  SEEK  MEDICAL CARE IF:     .      .        .       .    :    .    .              .    : 2000/12/06  : 2012/05/24  : 2012/03/28  ExitCare    2014 [employer].    -        ( 3 )        .     20    6  10  .          .           hiccuping.           .    .     .     Marland Kitchen                                .                  .         .       ().   (  )     .        .       .         .       .          .          .         .            .       Marland Kitchen              Marland Kitchen              Marland Kitchen                       .             .           Marland Kitchen                .                        .        .             ().            Marland Kitchen                    .                1-2   .          .                   .     .            .           .           .        .            .         .          .            .           .     Marland Kitchen               .             .          .            "  "  .        .           .          .           .        .       .      .         .       Marland Kitchen           Marland Kitchen  CARE      .             .          Marland Kitchen          .                  .        .                   ( )          () .         .               .            2.   :    .      .     .               .              .        .            .           .      .        .           .           .        .         .           .       .            Marland Kitchen       10       3    .            .          .               .                 .            .                 .      .               .      Marland Kitchen              Marland Kitchen             .             .      1    .       .                 .                     .                   .            .            .       .     .         .        .       .   .         .         .     :        .                 .  SEEK  MEDICAL CARE IF:          102   (38.9  )        .      .               .         .            .                 .          24 .       Marland Kitchen       .      .     2        2          .          .      () .       ()        .        .          .           .           1 .                    .       4-5   .          .            .        .    : 08/24/2001  : 2012/05/24  : 02/26/2009  Exit[employer]    2014 [employer].                    .                    .           Marland Kitchen               .                  .      ()           .               .          ()      .            .              (SIDS).           Marland Kitchen        .        .                Marland Kitchen              Marland Kitchen             Marland Kitchen                Marland Kitchen                Marland Kitchen                   .                         3 .         Marland Kitchen  2  3     4 5    .        8    24 .        3 4    .                  .         :     .  .       .            ().          .     .      .  .   .            .      :  .     .  .                     .              .     4      .        .          .                    ()    .                    .             .             ().       .             :      .     .      .           .       2 3       .           .       unlatches         .        :          .  .      .         .          .        :         .        .               5   .       .       .                     .     .           .          .      .        .     ()        .       4 6  .             .         .         Marland Kitchen               .                               .            :      .       .  8 12       24  .   3 5    :    3 5      24  .       .     3   4    24  .      .  5  7       3   6    24  .          5   .         7 10?  3    .       3 7   .    4 7        4    .      5           2 .               ().          .    24 48    .      :     .     .             .         .       .            .            .          Marland Kitchen                     .                .           .            24 48 .               .      .     .      .     Marland Kitchen     underwire .     3 4    .          .       .         .         .                 .      .   .    .  3   3  .          .         ( 8  ).                .     .     .           .                  .        .                       .     :               .      .     .      .       .     .     Marland Kitchen     Marland Kitchen  5  .    .        .      .      3    24 .      3    24 .              .        5   .    :    .    .              .    : 08/30/2005  : 2012/05/24  : 2012/04/05  [employer]    2014 [employer].             (TOLAC)             .          (VBAC). TOLAC             .    VBAC       .      VBAC     :   ( ) .   .       .       VBAC     :   .       .         VBAC   .        Marland Kitchen            TOLAC.     TOLAC  TOLAC     :    1  ( )   .     1      .       (macrosomic) .      ()  .       41  .   TOLAC       :      40.        (BMI)   30.             ??   .       .        .              .        TOLAC.          .         .  TOLAC                   .   TOLAC   .     TOLAC  TOLAC     :         .       cerix      (   ).      .      .               (nonrassuring ).    macrosomic.   postterm.       pregancy 42        .          TOLAC    .    TOLAC  The benefits of TOLAC include:  Having a faster recovery time.  Having less pain than with a cesarean.  Having the partner involved in the delivery process.  SUGGESTED RISKS OF TOLAC  The highest risk of complications happens to women who attempt a TOLAC and fail. A failed TOLAC results in an unplanned  cesarean delivery. Risks related to [facility] or repeat cesarean surgeries include:  Blood loss.  Infection.  Blood clot.  Injury to surrounding tissues or organs.  Removal of the uterus (hysterectomy).  Potential problems with the placenta (placenta previa, placental acreta) in future pregnancies.  While very rare, the main concerns with TOLAC are:  Rupture of the uterine scar from a past cesarean surgery.  Needing an emergency cesarean surgery.  Having a bad outcome for the baby (perinatal morbidity).  Having closely spaced pregnancies (less than 6 months apart).  ADVANTAGES TO HAVING A REPEAT CESAREAN DELIVERY  It is convenient to be able to schedule a cesarean delivery.  A woman can easily have surgery to prevent future pregnancies (sterilization) during a cesarean, if desired.  There is a lower rate of hysterectomy later in life due to the uterus falling down (pelvic relaxation).   The risks associated with TOLAC are not applicable.  FOR MORE INFORMATION  American Congress of Obstetricians and Gynecologists: www.acog.org  [employer]Mid[employer]: www.midwife.org  Document Released: 05/18/2011 Document Revised: 11/22/2011 Document Reviewed: 05/18/2011  [facility] Patient Information 2014 [place], [place]

## 2013-06-18 NOTE — Progress Notes (Signed)
Pulse: 76

## 2013-06-18 NOTE — Progress Notes (Signed)
FBS 78-106 6/14 are out of range, most < 95 2 hr pp 79-135--some out at lunch after eating rice.--adjusted diet. Discussed mode of delivery-given info to review--to sign papers next visit

## 2013-07-02 ENCOUNTER — Encounter: Payer: Self-pay | Admitting: Obstetrics and Gynecology

## 2013-07-02 ENCOUNTER — Ambulatory Visit (INDEPENDENT_AMBULATORY_CARE_PROVIDER_SITE_OTHER): Payer: Medicaid Other | Admitting: Obstetrics and Gynecology

## 2013-07-02 VITALS — BP 130/87 | Temp 97.0°F | Wt 160.7 lb

## 2013-07-02 DIAGNOSIS — O34219 Maternal care for unspecified type scar from previous cesarean delivery: Secondary | ICD-10-CM

## 2013-07-02 DIAGNOSIS — O24410 Gestational diabetes mellitus in pregnancy, diet controlled: Secondary | ICD-10-CM

## 2013-07-02 DIAGNOSIS — O99810 Abnormal glucose complicating pregnancy: Secondary | ICD-10-CM

## 2013-07-02 DIAGNOSIS — O0990 Supervision of high risk pregnancy, unspecified, unspecified trimester: Secondary | ICD-10-CM

## 2013-07-02 LAB — POCT URINALYSIS DIP (DEVICE)
Glucose, UA: NEGATIVE mg/dL
Ketones, ur: NEGATIVE mg/dL
Specific Gravity, Urine: 1.01 (ref 1.005–1.030)
Urobilinogen, UA: 0.2 mg/dL (ref 0.0–1.0)

## 2013-07-02 LAB — OB RESULTS CONSOLE GC/CHLAMYDIA
Chlamydia: NEGATIVE
Gonorrhea: NEGATIVE

## 2013-07-02 MED ORDER — GLUCOSE BLOOD VI STRP: ORAL_STRIP | Status: DC

## 2013-07-02 NOTE — Progress Notes (Signed)
Pulse- 98 Patient had some confusion with pharmacy about how about she was to check her blood sugars- needs refill on strips

## 2013-07-02 NOTE — Addendum Note (Signed)
Addended by: Franchot Mimes on: 07/02/2013 10:23 AM   Modules accepted: Orders

## 2013-07-02 NOTE — Progress Notes (Signed)
CBGs reviewed> all within target range but checks 3x/d. Teststrips and diet reviewed. Cultures done. TOLAC consent.

## 2013-07-02 NOTE — Patient Instructions (Signed)

## 2013-07-04 ENCOUNTER — Encounter: Payer: Self-pay | Admitting: *Deleted

## 2013-07-05 LAB — CULTURE, BETA STREP (GROUP B ONLY)

## 2013-07-05 LAB — OB RESULTS CONSOLE GBS: GBS: NEGATIVE

## 2013-07-09 ENCOUNTER — Ambulatory Visit (INDEPENDENT_AMBULATORY_CARE_PROVIDER_SITE_OTHER): Payer: Medicaid Other | Admitting: Obstetrics and Gynecology

## 2013-07-09 VITALS — BP 129/80 | Temp 97.6°F | Wt 160.5 lb

## 2013-07-09 DIAGNOSIS — O34219 Maternal care for unspecified type scar from previous cesarean delivery: Secondary | ICD-10-CM

## 2013-07-09 DIAGNOSIS — O99810 Abnormal glucose complicating pregnancy: Secondary | ICD-10-CM

## 2013-07-09 LAB — POCT URINALYSIS DIP (DEVICE)
Bilirubin Urine: NEGATIVE
Glucose, UA: NEGATIVE mg/dL
Ketones, ur: NEGATIVE mg/dL
Specific Gravity, Urine: <=1.005 (ref 1.005–1.030)
pH: 6.5 (ref 5.0–8.0)

## 2013-07-09 MED ORDER — ACCU-CHEK FASTCLIX LANCETS MISC: 1.0000 [IU] | Freq: Four times a day (QID) | Status: DC

## 2013-07-09 NOTE — Progress Notes (Signed)
Pulse- 98 

## 2013-07-11 ENCOUNTER — Encounter: Payer: Self-pay | Admitting: Obstetrics and Gynecology

## 2013-07-11 NOTE — Progress Notes (Signed)
CBGs reviewed. Fastings 80's, PPs in range exept isolated 133,141. Refilled lancets.

## 2013-07-11 NOTE — Patient Instructions (Signed)
Gestational Diabetes Mellitus Gestational diabetes mellitus, often simply referred to as gestational diabetes, is a type of diabetes that some women develop during pregnancy. In gestational diabetes, the pancreas does not make enough insulin (a hormone), the cells are less responsive to the insulin that is made (insulin resistance), or both.Normally, insulin moves sugars from food into the tissue cells. The tissue cells use the sugars for energy. The lack of insulin or the lack of normal response to insulin causes excess sugars to build up in the blood instead of going into the tissue cells. As a result, high blood sugar (hyperglycemia) develops. The effect of high sugar (glucose) levels can cause many complications.  RISK FACTORS You have an increased chance of developing gestational diabetes if you have a family history of diabetes and also have one or more of the following risk factors:  A body mass index over 30 (obesity).  A previous pregnancy with gestational diabetes.  An older age at the time of pregnancy. If blood glucose levels are kept in the normal range during pregnancy, women can have a healthy pregnancy. If your blood glucose levels are not well controlled, there may be risks to you, your unborn baby (fetus), your labor and delivery, or your newborn baby.  SYMPTOMS  If symptoms are experienced, they are much like symptoms you would normally expect during pregnancy. The symptoms of gestational diabetes include:   Increased thirst (polydipsia).  Increased urination (polyuria).  Increased urination during the night (nocturia).  Weight loss. This weight loss may be rapid.  Frequent, recurring infections.  Tiredness (fatigue).  Weakness.  Vision changes, such as blurred vision.  Fruity smell to your breath.  Abdominal pain. DIAGNOSIS Diabetes is diagnosed when blood glucose levels are increased. Your blood glucose level may be checked by one or more of the following  blood tests:  A fasting blood glucose test. You will not be allowed to eat for at least 8 hours before a blood sample is taken.  A random blood glucose test. Your blood glucose is checked at any time of the day regardless of when you ate.  A hemoglobin A1c blood glucose test. A hemoglobin A1c test provides information about blood glucose control over the previous 3 months.  An oral glucose tolerance test (OGTT). Your blood glucose is measured after you have not eaten (fasted) for 1 3 hours and then after you drink a glucose-containing beverage. Since the hormones that cause insulin resistance are highest at about 24 28 weeks of a pregnancy, an OGTT is usually performed during that time. If you have risk factors for gestational diabetes, your caregiver may test you for gestational diabetes earlier than 24 weeks of pregnancy. TREATMENT   You will need to take diabetes medicine or insulin daily to keep blood glucose levels in the desired range.  You will need to match insulin dosing with exercise and healthy food choices. The treatment goal is to maintain the before meal (preprandial), bedtime, and overnight blood glucose level at 60 99 mg/dL during pregnancy. The treatment goal is to further maintain peak after meal blood sugar (postprandial glucose) level at 100 140 mg/dL.  HOME CARE INSTRUCTIONS   Have your hemoglobin A1c level checked twice a year.  Perform daily blood glucose monitoring as directed by your caregiver. It is common to perform frequent blood glucose monitoring.  Monitor urine ketones when you are ill and as directed by your caregiver.  Take your diabetes medicine and insulin as directed by your caregiver to   maintain your blood glucose level in the desired range.  Never run out of diabetes medicine or insulin. It is needed every day.  Adjust insulin based on your intake of carbohydrates. Carbohydrates can raise blood glucose levels but need to be included in your diet.  Carbohydrates provide vitamins, minerals, and fiber which are an essential part of a healthy diet. Carbohydrates are found in fruits, vegetables, whole grains, dairy products, legumes, and foods containing added sugars.    Eat healthy foods. Alternate 3 meals with 3 snacks.  Maintain a healthy weight gain. The usual total expected weight gain varies according to your prepregnancy body mass index (BMI).  Carry a medical alert card or wear your medical alert jewelry.  Carry a 15 gram carbohydrate snack with you at all times to treat low blood glucose (hypoglycemia). Some examples of 15 gram carbohydrate snacks include:  Glucose tablets, 3 or 4   Glucose gel, 15 gram tube  Raisins, 2 tablespoons (24 g)  Jelly beans, 6  Animal crackers, 8  Fruit juice, regular soda, or low fat milk, 4 ounces (120 mL)  Gummy treats, 9    Recognize hypoglycemia. Hypoglycemia during pregnancy occurs with blood glucose levels of 60 mg/dL and below. The risk for hypoglycemia increases when fasting or skipping meals, during or after intense exercise, and during sleep. Hypoglycemia symptoms can include:  Tremors or shakes.  Decreased ability to concentrate.  Sweating.  Increased heart rate.  Headache.  Dry mouth.  Hunger.  Irritability.  Anxiety.  Restless sleep.  Altered speech or coordination.  Confusion.  Treat hypoglycemia promptly. If you are alert and able to safely swallow, follow the 15:15 rule:  Take 15 20 grams of rapid-acting glucose or carbohydrate. Rapid-acting options include glucose gel, glucose tablets, or 4 ounces (120 mL) of fruit juice, regular soda, or low fat milk.  Check your blood glucose level 15 minutes after taking the glucose.   Take 15 20 grams more of glucose if the repeat blood glucose level is still 70 mg/dL or below.  Eat a meal or snack within 1 hour once blood glucose levels return to normal.  Be alert to polyuria and polydipsia which are early  signs of hyperglycemia. An early awareness of hyperglycemia allows for prompt treatment. Treat hyperglycemia as directed by your caregiver.  Engage in at least 30 minutes of physical activity a day or as directed by your caregiver. Ten minutes of physical activity timed 30 minutes after each meal is encouraged to control postprandial blood glucose levels.  Adjust your insulin dosing and food intake as needed if you start a new exercise or sport.  Follow your sick day plan at any time you are unable to eat or drink as usual.  Avoid tobacco and alcohol use.  Follow up with your caregiver regularly.  Follow the advice of your caregiver regarding your prenatal and post-delivery (postpartum) appointments, meal planning, exercise, medicines, vitamins, blood tests, other medical tests, and physical activities.  Perform daily skin and foot care. Examine your skin and feet daily for cuts, bruises, redness, nail problems, bleeding, blisters, or sores.  Brush your teeth and gums at least twice a day and floss at least once a day. Follow up with your dentist regularly.  Schedule an eye exam during the first trimester of your pregnancy or as directed by your caregiver.  Share your diabetes management plan with your workplace or school.  Stay up-to-date with immunizations.  Learn to manage stress.  Obtain ongoing diabetes education   and support as needed. SEEK MEDICAL CARE IF:   You are unable to eat food or drink fluids for more than 6 hours.  You have nausea and vomiting for more than 6 hours.  You have a blood glucose level of 200 mg/dL and you have ketones in your urine.  There is a change in mental status.  You develop vision problems.  You have a persistent headache.  You have upper abdominal pain or discomfort.  You develop an additional serious illness.  You have diarrhea for more than 6 hours.  You have been sick or have had a fever for a couple of days and are not getting  better. SEEK IMMEDIATE MEDICAL CARE IF:   You have difficulty breathing.  You no longer feel the baby moving.  You are bleeding or have discharge from your vagina.  You start having premature contractions or labor. MAKE SURE YOU:  Understand these instructions.  Will watch your condition.  Will get help right away if you are not doing well or get worse. Document Released: 12/06/2000 Document Revised: 05/24/2012 Document Reviewed: 03/28/2012 [employer] Patient Information 2014 [employer].  

## 2013-07-16 ENCOUNTER — Ambulatory Visit (INDEPENDENT_AMBULATORY_CARE_PROVIDER_SITE_OTHER): Payer: Medicaid Other | Admitting: Family Medicine

## 2013-07-16 ENCOUNTER — Encounter: Payer: Self-pay | Admitting: Family Medicine

## 2013-07-16 VITALS — BP 137/84 | Temp 97.0°F | Wt 161.2 lb

## 2013-07-16 DIAGNOSIS — O0990 Supervision of high risk pregnancy, unspecified, unspecified trimester: Secondary | ICD-10-CM

## 2013-07-16 DIAGNOSIS — O24419 Gestational diabetes mellitus in pregnancy, unspecified control: Secondary | ICD-10-CM

## 2013-07-16 DIAGNOSIS — O99810 Abnormal glucose complicating pregnancy: Secondary | ICD-10-CM

## 2013-07-16 LAB — POCT URINALYSIS DIP (DEVICE)
Bilirubin Urine: NEGATIVE
Ketones, ur: NEGATIVE mg/dL
Protein, ur: NEGATIVE mg/dL
Specific Gravity, Urine: 1.01 (ref 1.005–1.030)
pH: 6.5 (ref 5.0–8.0)

## 2013-07-16 NOTE — Progress Notes (Signed)
A1DM - controlled   Fasting- 77-84 Postprandial: all less than 120 except one 131.  No ctx, vb, lof.  +FM  O: see flowsheet   A/P - doing well.  Dm at goal Schedule [place] for growth at 38 weeks - labor precautions discussed  - A1DM: well controlled - schedule growth [place] @ 38 weeks - FM/labor precautions discussed -f/u in 1 week

## 2013-07-16 NOTE — Progress Notes (Signed)
Pulse: 86 Pt doesn't want to use the language line if her interpreter doesn't show up. She speaks some english and states her husband speaks english and will help her.

## 2013-07-16 NOTE — Progress Notes (Signed)
U/S scheduled 07/18/13 at 315 pm.(MFM schedule full).

## 2013-07-16 NOTE — Patient Instructions (Signed)

## 2013-07-18 ENCOUNTER — Ambulatory Visit (HOSPITAL_COMMUNITY)
Admission: RE | Admit: 2013-07-18 | Discharge: 2013-07-18 | Disposition: A | Discharge status: Home / Self Care | Payer: Medicaid Other | Source: Ambulatory Visit | Attending: Family Medicine | Admitting: Family Medicine

## 2013-07-18 ENCOUNTER — Other Ambulatory Visit: Payer: Self-pay | Admitting: Family Medicine

## 2013-07-18 DIAGNOSIS — O24419 Gestational diabetes mellitus in pregnancy, unspecified control: Secondary | ICD-10-CM

## 2013-07-18 DIAGNOSIS — O99810 Abnormal glucose complicating pregnancy: Secondary | ICD-10-CM | POA: Insufficient documentation

## 2013-07-18 DIAGNOSIS — O0990 Supervision of high risk pregnancy, unspecified, unspecified trimester: Secondary | ICD-10-CM

## 2013-07-18 DIAGNOSIS — Z3689 Encounter for other specified antenatal screening: Secondary | ICD-10-CM | POA: Insufficient documentation

## 2013-07-23 ENCOUNTER — Encounter (HOSPITAL_COMMUNITY): Payer: Self-pay | Admitting: *Deleted

## 2013-07-23 ENCOUNTER — Inpatient Hospital Stay (HOSPITAL_COMMUNITY)
Admission: AD | Admit: 2013-07-23 | Discharge: 2013-07-23 | Disposition: A | Discharge status: Home / Self Care | Payer: Medicaid Other | Source: Ambulatory Visit | Attending: Obstetrics & Gynecology | Admitting: Obstetrics & Gynecology

## 2013-07-23 ENCOUNTER — Encounter: Payer: Self-pay | Admitting: Family Medicine

## 2013-07-23 ENCOUNTER — Encounter: Payer: Medicaid Other | Admitting: Family Medicine

## 2013-07-23 DIAGNOSIS — O479 False labor, unspecified: Secondary | ICD-10-CM

## 2013-07-23 DIAGNOSIS — O471 False labor at or after 37 completed weeks of gestation: Secondary | ICD-10-CM

## 2013-07-23 NOTE — MAU Provider Note (Signed)
  History     CSN: [id number]  Arrival date and time: 07/23/13 0272   First Provider Initiated Contact with Patient 07/23/13 0654      Chief Complaint  Patient presents with  . Rupture of Membranes  . Vaginal Bleeding  . Abdominal Pain   Vaginal Bleeding Associated symptoms include abdominal pain.  Abdominal Pain    Carla Miles is a 27 y.o. G2P0101 at [redacted]w[redacted]d who presents today for contractions and possible ROM. She states that she noticed some leakage of fluid around 2100 last night, and started having contractions around 0400 today. She confirms fetal movement. She denies any bleeding.   Past Medical History  Diagnosis Date  . Gestational diabetes     Past Surgical History  Procedure Laterality Date  . Cesarean section  2010    History reviewed. No pertinent family history.  History  Substance Use Topics  . Smoking status: Never Smoker   . Smokeless tobacco: Never Used  . Alcohol Use: No    Allergies:  Allergies  Allergen Reactions  . Ibuprofen Rash    Prescriptions prior to admission  Medication Sig Dispense Refill  . ACCU-CHEK FASTCLIX LANCETS MISC 1 Units by Percutaneous route 4 (four) times daily.  100 each  12  . acetaminophen (TYLENOL) 325 MG tablet Take 650 mg by mouth every 6 (six) hours as needed.      . Blood Glucose Monitoring Suppl (ACCU-CHEK NANO SMARTVIEW) W/DEVICE KIT 1 kit by Subdermal route as directed. Check blood sugars for fasting, and two hours after breakfast, lunch and dinner (4 checks daily)  1 kit  0  . glucose blood (ACCU-CHEK SMARTVIEW) test strip Use as instructed to check blood sugars  100 each  12  . Prenatal Vit-Fe Fumarate-FA (PRENATAL VITAMINS PLUS) 27-1 MG TABS Take 1 tablet by mouth daily.        Review of Systems  Gastrointestinal: Positive for abdominal pain.  Genitourinary: Positive for vaginal bleeding.   Physical Exam   Blood pressure 125/82, pulse 86, resp. rate 20, height 5' (1.524 m), weight 72.938 kg (160 lb  12.8 oz), last menstrual period 11/11/2012.  Physical Exam  Nursing note and vitals reviewed. Constitutional: She is oriented to person, place, and time. She appears well-developed and well-nourished. No distress.  Cardiovascular: Normal rate.   Respiratory: Effort normal.  GI: Soft. There is no tenderness.  Genitourinary:    External: no lesion Vagina: small amount of white discharge, no pooling seen Cervix: pink, smooth, no fluid seen with valsalva Uterus: AGA  FHT: 130, moderate with 15x15 accels, no decels Toco: irritability   Neurological: She is alert and oriented to person, place, and time.  Skin: Skin is warm and dry.  Psychiatric: She has a normal mood and affect.    MAU Course  Procedures  Fern: negative   Assessment and Plan   1. False labor after 37 completed weeks of gestation    Labor precautions FU with the clinic as planned Return to MAU as needed   Tawnya Crook 07/23/2013, 7:04 AM

## 2013-07-23 NOTE — Progress Notes (Signed)
At checkout window patient and her husband stating they were not able to get another bottle test strips- per bottle has 12 refills, but does not states how many to use per day. Informed patient and her husband will call pharmacy and clarify order.  Called [facility] and gave new order to check blood glucose QID.

## 2013-07-23 NOTE — MAU Note (Signed)
Patient presents with complaints of leaking of fluid since last night at 9pm . Contractions and bleeding 4am.

## 2013-07-24 ENCOUNTER — Inpatient Hospital Stay (HOSPITAL_COMMUNITY)
Admission: AD | Admit: 2013-07-24 | Discharge: 2013-07-26 | DRG: 775 | Disposition: A | Discharge status: Home / Self Care | Payer: Medicaid Other | Source: Ambulatory Visit | Attending: Obstetrics & Gynecology | Admitting: Obstetrics & Gynecology

## 2013-07-24 ENCOUNTER — Encounter (HOSPITAL_COMMUNITY): Payer: Self-pay

## 2013-07-24 DIAGNOSIS — O34219 Maternal care for unspecified type scar from previous cesarean delivery: Secondary | ICD-10-CM

## 2013-07-24 DIAGNOSIS — O24419 Gestational diabetes mellitus in pregnancy, unspecified control: Secondary | ICD-10-CM

## 2013-07-24 DIAGNOSIS — O99814 Abnormal glucose complicating childbirth: Secondary | ICD-10-CM

## 2013-07-24 DIAGNOSIS — O0990 Supervision of high risk pregnancy, unspecified, unspecified trimester: Secondary | ICD-10-CM

## 2013-07-24 LAB — TYPE AND SCREEN: ABO/RH(D): AB POS

## 2013-07-24 LAB — CBC
HCT: 38.7 % (ref 36.0–46.0)
MCV: 83.2 fL (ref 78.0–100.0)
Platelets: 212 10*3/uL (ref 150–400)
RBC: 4.65 MIL/uL (ref 3.87–5.11)
RDW: 13.1 % (ref 11.5–15.5)
WBC: 10.5 10*3/uL (ref 4.0–10.5)

## 2013-07-24 LAB — GLUCOSE, CAPILLARY
Glucose-Capillary: 90 mg/dL (ref 70–99)
Glucose-Capillary: 78 mg/dL (ref 70–99)

## 2013-07-24 LAB — ABO/RH: ABO/RH(D): AB POS

## 2013-07-24 MED ORDER — DIBUCAINE 1 % RE OINT
1.0000 "application " | TOPICAL_OINTMENT | RECTAL | Status: DC | PRN
  Filled 2013-07-24: qty 28

## 2013-07-24 MED ORDER — PRENATAL MULTIVITAMIN CH
1.0000 | ORAL_TABLET | Freq: Every day | ORAL | Status: DC
Start: 2013-07-24 — End: 2013-07-26
  Administered 2013-07-25: 1 via ORAL
  Filled 2013-07-24: qty 1

## 2013-07-24 MED ORDER — FLEET ENEMA 7-19 GM/118ML RE ENEM: 1.0000 | ENEMA | RECTAL | Status: DC | PRN

## 2013-07-24 MED ORDER — TETANUS-DIPHTH-ACELL PERTUSSIS 5-2.5-18.5 LF-MCG/0.5 IM SUSP
0.5000 mL | Freq: Once | INTRAMUSCULAR | Status: DC
  Filled 2013-07-24: qty 0.5

## 2013-07-24 MED ORDER — CITRIC ACID-SODIUM CITRATE 334-500 MG/5ML PO SOLN: 30.0000 mL | ORAL | Status: DC | PRN

## 2013-07-24 MED ORDER — ZOLPIDEM TARTRATE 5 MG PO TABS: 5.0000 mg | ORAL_TABLET | Freq: Every evening | ORAL | Status: DC | PRN

## 2013-07-24 MED ORDER — DIPHENHYDRAMINE HCL 25 MG PO CAPS: 25.0000 mg | ORAL_CAPSULE | Freq: Four times a day (QID) | ORAL | Status: DC | PRN

## 2013-07-24 MED ORDER — ONDANSETRON HCL 4 MG/2ML IJ SOLN: 4.0000 mg | INTRAMUSCULAR | Status: DC | PRN

## 2013-07-24 MED ORDER — LACTATED RINGERS IV SOLN
500.0000 mL | INTRAVENOUS | Status: DC | PRN
  Administered 2013-07-24 (×2): 1000 mL via INTRAVENOUS

## 2013-07-24 MED ORDER — FENTANYL CITRATE 0.05 MG/ML IJ SOLN
100.0000 ug | INTRAMUSCULAR | Status: DC | PRN
  Administered 2013-07-24 (×5): 100 ug via INTRAVENOUS
  Filled 2013-07-24 (×5): qty 2

## 2013-07-24 MED ORDER — WITCH HAZEL-GLYCERIN EX PADS: 1.0000 "application " | MEDICATED_PAD | CUTANEOUS | Status: DC | PRN

## 2013-07-24 MED ORDER — ACETAMINOPHEN 325 MG PO TABS: 650.0000 mg | ORAL_TABLET | ORAL | Status: DC | PRN

## 2013-07-24 MED ORDER — ONDANSETRON HCL 4 MG PO TABS: 4.0000 mg | ORAL_TABLET | ORAL | Status: DC | PRN

## 2013-07-24 MED ORDER — OXYCODONE-ACETAMINOPHEN 5-325 MG PO TABS
1.0000 | ORAL_TABLET | ORAL | Status: DC | PRN
  Administered 2013-07-24: 2 via ORAL
  Filled 2013-07-24: qty 2

## 2013-07-24 MED ORDER — DOCUSATE SODIUM 100 MG PO CAPS
100.0000 mg | ORAL_CAPSULE | Freq: Every day | ORAL | Status: DC
  Administered 2013-07-25 – 2013-07-26 (×2): 100 mg via ORAL
  Filled 2013-07-24 (×2): qty 1

## 2013-07-24 MED ORDER — ONDANSETRON HCL 4 MG/2ML IJ SOLN: 4.0000 mg | Freq: Four times a day (QID) | INTRAMUSCULAR | Status: DC | PRN

## 2013-07-24 MED ORDER — BENZOCAINE-MENTHOL 20-0.5 % EX AERO
1.0000 "application " | INHALATION_SPRAY | CUTANEOUS | Status: DC | PRN
  Filled 2013-07-24 (×2): qty 56

## 2013-07-24 MED ORDER — LIDOCAINE HCL (PF) 1 % IJ SOLN
30.0000 mL | INTRAMUSCULAR | Status: DC | PRN
  Administered 2013-07-24: 30 mL via SUBCUTANEOUS
  Filled 2013-07-24 (×2): qty 30

## 2013-07-24 MED ORDER — OXYTOCIN BOLUS FROM INFUSION
500.0000 mL | INTRAVENOUS | Status: DC
  Administered 2013-07-24: 500 mL via INTRAVENOUS

## 2013-07-24 MED ORDER — SIMETHICONE 80 MG PO CHEW: 80.0000 mg | CHEWABLE_TABLET | ORAL | Status: DC | PRN

## 2013-07-24 MED ORDER — LANOLIN HYDROUS EX OINT: TOPICAL_OINTMENT | CUTANEOUS | Status: DC | PRN

## 2013-07-24 MED ORDER — OXYTOCIN 40 UNITS IN LACTATED RINGERS INFUSION - SIMPLE MED
62.5000 mL/h | INTRAVENOUS | Status: DC
  Filled 2013-07-24: qty 1000

## 2013-07-24 MED ORDER — SENNOSIDES-DOCUSATE SODIUM 8.6-50 MG PO TABS
2.0000 | ORAL_TABLET | ORAL | Status: DC
  Administered 2013-07-25 (×2): 2 via ORAL
  Filled 2013-07-24 (×2): qty 2

## 2013-07-24 MED ORDER — LACTATED RINGERS IV SOLN
INTRAVENOUS | Status: DC
  Administered 2013-07-24: 125 mL/h via INTRAVENOUS

## 2013-07-24 MED ORDER — OXYCODONE-ACETAMINOPHEN 5-325 MG PO TABS
1.0000 | ORAL_TABLET | ORAL | Status: DC | PRN
  Administered 2013-07-24 – 2013-07-26 (×5): 1 via ORAL
  Filled 2013-07-24: qty 2
  Filled 2013-07-24 (×4): qty 1

## 2013-07-24 NOTE — MAU Note (Signed)
Per Dr Michail Jewels, watch pt for one hour and recheck cervix.

## 2013-07-24 NOTE — Progress Notes (Signed)
Carla Miles is a 27 y.o. G2P0101 at [redacted]w[redacted]d   Subjective: Patient laboring without pain medication and resting between contractions.   Objective: BP 110/49  Pulse 65  Temp(Src) 98.4 F (36.9 C) (Oral)  Resp 22  Ht 5' (1.524 m)  Wt 72.938 kg (160 lb 12.8 oz)  BMI 31.40 kg/m2  SpO2 100%  LMP 11/11/2012      FHT:  FHR: 145 bpm, variability: moderate,  accelerations:  Present,  decelerations:  Present variables that return to baseline UC:   irregular, every 2-5 minutes SVE:   Dilation: 10 Effacement (%): 100 Station: 0 Exam by:: [employer] RN  Labs: Lab Results  Component Value Date   WBC 10.5 07/24/2013   HGB 13.6 07/24/2013   HCT 38.7 07/24/2013   MCV 83.2 07/24/2013   PLT 212 07/24/2013    Assessment / Plan: Spontaneous labor, progressing normally Labor: Progressing normally Fetal Wellbeing:  Category II Pain Control:  Labor support without medications I/D:  n/a Anticipated MOD:  NSVD  AROM with clear fluid Start pushing with RN when pressure felt  Carla Miles 07/24/2013, 10:58 AM Evaluation and management procedures were performed by CNM under my supervision/collaboration. Chart reviewed, patient examined by me and I agree with management and plan. Per SNM: ant lip after AROM. Danae Orleans, CNM 07/24/2013 12:13 PM

## 2013-07-24 NOTE — Progress Notes (Signed)
UR completed 

## 2013-07-24 NOTE — H&P (Signed)
Carla Miles is a 27 y.o. female presenting for onset of painful contractions beginning 1 day ago. Pt presented to MAU yesterday morning and was sent home with false labor given lack of cervical change. Presented this evening after bloody show and worsening contraction pain felt in lower back and suprapubic region. Per pt and husband pain began to become closer together lasting 1-2 mins happening every 3-4 minutes and increasing to 10/10 in intensity. Had one episode of NBNB emesis in the MAU 2/2 pain. No gush of fluid, no vag bleeding, + FM.   PNC at [facility]. Was initially seen in the HD but transferred care here in 3rd trimester. Preg complicated by GDM, diet controlled with CBGs in 70s-110s. No hypoglycemic episodes. Otherwise nml genetic screen, nml [place], GBS neg. Desires to breast feed and use condoms.  Maternal Medical History:  Reason for admission: Nausea.     OB History   Grav Para Term Preterm Abortions TAB SAB Ect Mult Living   2 1 0 1      1     1. Preterm 36w3 M LTCS (previa) 2. Current  Past Medical History  Diagnosis Date  . Gestational diabetes    Past Surgical History  Procedure Laterality Date  . Cesarean section  2010   Family History: family history is not on file. Social History:  reports that she has never smoked. She has never used smokeless tobacco. She reports that she does not drink alcohol or use illicit drugs.   Prenatal Transfer Tool  Maternal Diabetes: Yes:  Diabetes Type:  Diet controlled Genetic Screening: Normal Maternal Ultrasounds/Referrals: Normal Fetal Ultrasounds or other Referrals:  None Maternal Substance Abuse:  No Significant Maternal Medications:  None Significant Maternal Lab Results:  None GBS neg Other Comments:  None  Review of Systems  Constitutional: Negative for fever and malaise/fatigue.  HENT: Negative for congestion and sore throat.   Eyes: Negative for blurred vision.  Respiratory: Negative for cough and shortness of breath.    Cardiovascular: Negative for chest pain, palpitations and leg swelling.  Gastrointestinal: Positive for nausea, vomiting and abdominal pain. Negative for heartburn, diarrhea and constipation.  Genitourinary: Negative for dysuria, urgency and frequency.  Musculoskeletal: Negative for myalgias.  Skin: Negative for rash.  Neurological: Negative for dizziness, seizures, loss of consciousness and headaches.  Endo/Heme/Allergies: Does not bruise/bleed easily.  Psychiatric/Behavioral: Negative for depression.    Dilation: 4 Effacement (%): 70;80 Station: -2 Exam by:: D Simpson RN Blood pressure 128/80, pulse 72, temperature 97.8 F (36.6 C), temperature source Oral, resp. rate 18, last menstrual period 11/11/2012, SpO2 100.00%. Exam Physical Exam  Constitutional: She is oriented to person, place, and time. She appears well-developed and well-nourished. No distress.  HENT:  Head: Normocephalic and atraumatic.  Eyes: EOM are normal.  Neck: Neck supple.  Cardiovascular: Normal rate and regular rhythm.   No murmur heard. Respiratory: Effort normal and breath sounds normal. No respiratory distress.  GI: Soft. Bowel sounds are normal. There is no tenderness. There is no rebound.  Gravid   Musculoskeletal: Normal range of motion. She exhibits no edema.  Lymphadenopathy:    She has no cervical adenopathy.  Neurological: She is alert and oriented to person, place, and time. She has normal reflexes. She exhibits normal muscle tone.  Skin: Skin is warm and dry. No erythema.  Psychiatric: She has a normal mood and affect. Her behavior is normal.    Dilation: 4 Effacement (%): 70;80 Cervical Position: Middle Station: -2 Presentation: Vertex Exam  by:: D Simpson RN  Prenatal labs: ABO, Rh:  AB post Antibody:  neg Rubella:  Immune RPR:   NR HBsAg:   neg HIV:   neg GBS:   neg  FHR- baseline 130, mod var, + accel, no decel UC- q2-4  Assessment/Plan: Ms Vitullo is a 26y.o [id number] who  presents at 39w0 in active labor at term  1. Active labor- making active cervical change, contractions q2-4 mins. Desires TOLAC. (previous CS 2/2 previa) Will monitor for 4 hrs and reassess need for augmentation at that time -routine orders -pain control with fentanyl prn, pt does not desire epidural -HIV NR, GBS neg -expect NSVD  2. A2DM- diet controlled with CGBS from 70s-110s. Compliant with strict diet regimen. Anatomic [place] at 38 weeks growth at 56%.  -CBGs q4, then q2 in active labor -no indication for medication at this time  3. FHR- reassuring -cat I tracing   4. Postpartum- f/up in [facility] -desires to breast feed -plans to use condoms for contraception Anselm Lis 07/24/2013, 4:08 AM   Carla Miles is a 27 y.o. G2P0101 at [redacted]w[redacted]d here for SOOL #Labor: Monitor 4hr, and augment if unchanged. #Pain: Desires IV pain meds #FWB: Cat I #ID:  GBS Neg, no Abx #MOF: Breast #MOC: Condoms  I spoke with and examined patient and agree with resident's note and plan of care.  Tawana Scale, MD OB Fellow 07/24/2013 4:53 AM

## 2013-07-24 NOTE — Progress Notes (Signed)
  This encounter was created in error - please disregard.--pt. Saw RN only

## 2013-07-24 NOTE — Progress Notes (Signed)
FOB given permission via consent to translate for patient.

## 2013-07-24 NOTE — MAU Provider Note (Signed)
Attestation of Attending Supervision of Advanced Practitioner (CNM/NP): Evaluation and management procedures were performed by the Advanced Practitioner under my supervision and collaboration. I have reviewed the Advanced Practitioner's note and chart, and I agree with the management and plan.  Janyah Singleterry H. 5:19 PM   

## 2013-07-25 LAB — CBC
MCHC: 34.3 g/dL (ref 30.0–36.0)
Platelets: 177 10*3/uL (ref 150–400)
RDW: 13.5 % (ref 11.5–15.5)
WBC: 9.7 10*3/uL (ref 4.0–10.5)

## 2013-07-25 NOTE — Progress Notes (Signed)
Post Partum Day 1 Subjective: up ad lib, voiding, tolerating PO and + flatus  Carla Miles is a 26yo [id number] who presented at [redacted]w[redacted]d on 11/11 after bloody show and worsening ctx that began on 11/10.  Pt originally seen in HD, but transferred to [facility] in 3rd trimester.  Pregnancy complicated by GDMA1, diet-controlled with CBGs in 70s-110s.  No other pregnancy complications noted. Her labor progressed appropriately, without pain medications or epidural, and she delivered a viable baby boy via NSVD.  She had a 3rd degree perineal laceration, repaired appropriately with 2.0 and 3.0 vircyl rapide.  EBL . CBGs during labor 78-90.  Language line Hindu interpreter used for today's interview.  She reports feeling better today, but does report moderate pain at 4-5/10 near her stitches.  She does noticed increased pelvic pain with lactation, but it is tolerable.  She states that her bleeding is now like a normal period, and she denies passage of any large clots.  She is ambulatory per usual, and reports a normal appetite.  She has urinated and passed flatus, but has not had a BM yet.  She does report discomfort and stinging of her stitches when urinating and changing positions, but it is tolerable.  She is taking Motrin 600mg  Q6H and has been taking Percocet 3-325mg  overnight for pain; last percocet dose at 330am. She denies any HA, CP, SOB, N/V, diarrhea, fever, chills, LE swelling or pain. She is breast feeding and is not having any issues at this time.  She any her husband will use condoms for contraception.   Objective: Blood pressure 104/60, pulse 78, temperature 98.3 F (36.8 C), temperature source Oral, resp. rate 18, height 5' (1.524 m), weight 72.938 kg (160 lb 12.8 oz), last menstrual period 11/11/2012, SpO2 100.00%  Physical Exam:  General: alert, cooperative and no distress Lochia: appropriate Uterine Fundus: firm Incision: healing well, no significant drainage DVT Evaluation: No evidence of DVT  seen on physical exam. Negative Homan's sign. No cords or calf tenderness. No significant calf/ankle edema. Cardiovascular: S1/S2 distinct. No murmur, rubs, or extra heart sounds. Lungs: CTAB  Recent Labs  07/24/13 0430  HGB 13.6  HCT 38.7   Results for orders placed during the hospital encounter of 07/24/13 (from the past 24 hour(s))  GLUCOSE, CAPILLARY     Status: None   Collection Time    07/24/13  8:31 AM      Result Value Range   Glucose-Capillary 78  70 - 99 mg/dL     Assessment/Plan: Plan for discharge tomorrow, Breastfeeding and Contraception condoms. Circumcision planned outside of hospital.    LOS: 1 day   Carla Miles 07/25/2013, 7:41 AM   I was present for the exam and agree with above. May request D/C this afternoon if pain controlled and has BM.   Carla Miles, CNM 07/25/2013 9:19 AM

## 2013-07-26 MED ORDER — POLYETHYLENE GLYCOL 3350 17 G PO PACK: 17.0000 g | PACK | Freq: Every day | ORAL | Status: DC

## 2013-07-26 MED ORDER — OXYCODONE-ACETAMINOPHEN 5-325 MG PO TABS: 1.0000 | ORAL_TABLET | ORAL | Status: DC | PRN

## 2013-07-26 NOTE — Discharge Summary (Signed)
HPI obtained with assistance of Hindi language line interpreter.   Obstetric Discharge Summary Reason for Admission: onset of labor and TOLAC Prenatal Procedures: NST Intrapartum Procedures: vaginal birth after cesarean Postpartum Procedures: none Complications-Operative and Postpartum: 3rd degree perineal laceration Hemoglobin  Date Value Range Status  07/25/2013 10.5* 12.0 - 15.0 g/dL Final     REPEATED TO VERIFY     DELTA CHECK NOTED     HCT  Date Value Range Status  07/25/2013 30.6* 36.0 - 46.0 % Final    Physical Exam:  General: alert, cooperative and no distress Lochia: appropriate Uterine Fundus: firm Incision: n/a DVT Evaluation: No evidence of DVT seen on physical exam.  Discharge Diagnoses: Term Pregnancy-delivered  Discharge Information: Date: 07/26/2013 Activity: pelvic rest Diet: routine Medications: Percocet and Miralax Condition: stable Instructions: refer to practice specific booklet Discharge to: home Follow-up Information   Follow up with [facility]. Schedule an appointment as soon as possible for a visit in 4 weeks. (for postpartum visit)    Contact information:   [address] [place] [address]       Newborn Data: Live born female  Birth Weight: 6 lb 6.8 oz (2915 g) APGAR: 5, 9  Home with mother.  Hospital course: Pt was admitted with term active labor. Her prenatal course was complicated by diet-controlled gestational diabetes and history of prior c-section due to placenta previa with desire to [facility] this pregnancy. She is GBS negative and did not require antibiotic treatment. She progressed well and had a successful VBAC. Her delivery was complicated by a 3rd degree laceration which was repaired. Her postpartum course was uncomplicated. She will remain on stool softeners for at least 4 weeks postpartum. She is breast feeding. She plans to use condoms for postpartum contraception. She will follow up at [facility] for her  postpartum care.   Cowart, Ryann 07/26/2013, 8:39 AM  I have seen and examined this patient and I agree with the above. Cam Hai 12:19 AM 07/28/2013

## 2013-07-26 NOTE — Lactation Note (Signed)
This note was copied from the chart of Carla Ziza Lacosse. Lactation Consultation Note: Follow up visit with mom before DC. Experienced BF mom reports with family member interpreting for me that baby is nursing well. Has a little pain in the beginning of the feeding but then eases off. Reports that nipples feel fine. No questions at present. To call prn  Patient Name: Carla Miles [id number] Date: 07/26/2013 Reason for consult: Follow-up assessment   Maternal Data    Feeding   LATCH Score/Interventions                      Lactation Tools Discussed/Used     Consult Status Consult Status: Complete    Pamelia Hoit 07/26/2013, 8:38 AM

## 2013-07-29 NOTE — Discharge Summary (Signed)
Attestation of Attending Supervision of Advanced Practitioner: Evaluation and management procedures were performed by the PA/NP/CNM/OB Fellow under my supervision/collaboration. Chart reviewed and agree with management and plan.  Lasandra Batley V 07/29/2013 8:02 PM

## 2013-08-23 ENCOUNTER — Ambulatory Visit (INDEPENDENT_AMBULATORY_CARE_PROVIDER_SITE_OTHER): Payer: Medicaid Other | Admitting: Medical

## 2013-08-23 ENCOUNTER — Encounter: Payer: Self-pay | Admitting: Obstetrics & Gynecology

## 2013-08-23 DIAGNOSIS — O99810 Abnormal glucose complicating pregnancy: Secondary | ICD-10-CM

## 2013-08-23 DIAGNOSIS — O34219 Maternal care for unspecified type scar from previous cesarean delivery: Secondary | ICD-10-CM

## 2013-08-23 DIAGNOSIS — O0990 Supervision of high risk pregnancy, unspecified, unspecified trimester: Secondary | ICD-10-CM

## 2013-08-23 MED ORDER — PRENATAL VITAMINS PLUS 27-1 MG PO TABS: 1.0000 | ORAL_TABLET | Freq: Every day | ORAL | Status: DC

## 2013-08-23 NOTE — Progress Notes (Addendum)
Patient ID: Carla Miles, female   DOB: December 17, 1985, 27 y.o.   MRN: [id number] Subjective:     Carla Miles is a 27 y.o. female who presents for a postpartum visit. She is 4 weeks postpartum following a VBAC. I have fully reviewed the prenatal and intrapartum course. The delivery was at 39.0 gestational weeks. Outcome: vaginal birth after cesarean (VBAC). Anesthesia: none. Postpartum course has been normal. Baby's course has been normal. Baby is feeding by breast. Bleeding staining only. Bowel function is normal. Bladder function is normal. Patient is not sexually active. Contraception method is none. Postpartum depression screening: negative.  The following portions of the patient's history were reviewed and updated as appropriate: allergies, current medications, past family history, past medical history, past social history, past surgical history and problem list.  Review of Systems Pertinent items are noted in HPI.   Objective:    BP 108/73  Pulse 71  Temp(Src) 97.3 F (36.3 C) (Oral)  Ht 5' (1.524 m)  Wt 141 lb 3.2 oz (64.048 kg)  BMI 27.58 kg/m2  Breastfeeding? Yes  General:  alert and cooperative   Breasts:  not performed  Lungs: clear to auscultation bilaterally  Heart:  regular rate and rhythm, S1, S2 normal, no murmur, click, rub or gallop  Abdomen: soft, non-tender; bowel sounds normal; no masses,  no organomegaly and normal bowel sounds in all quadrants   Vulva:  not evaluated  Vagina: not evaluated  Cervix:  not evaluated  Corpus: normal size, contour, position, consistency, mobility, non-tender  Adnexa:  not evaluated  Rectal Exam: Not performed.        Assessment:     Normal postpartum exam. Pap smear not done at today's visit.   Plan:    1. Contraception: condoms 2. Patient to return to [facility] in [place] for annual exam when due. Patient instructed to contact them for that appointment 3. Follow up in: [facility] clinic as needed.  4. Rx for prenatal vitamins sent to patient's  pharmacy at patient's request

## 2013-08-23 NOTE — Addendum Note (Signed)
Addended by: Freddi Starr on: 08/23/2013 02:16 PM   Modules accepted: Orders

## 2013-08-23 NOTE — Patient Instructions (Signed)

## 2014-07-15 ENCOUNTER — Encounter: Payer: Self-pay | Admitting: Obstetrics & Gynecology

## 2020-10-13 ENCOUNTER — Ambulatory Visit: Payer: Medicaid Other | Admitting: Student

## 2020-10-13 ENCOUNTER — Other Ambulatory Visit: Payer: Self-pay

## 2020-10-13 ENCOUNTER — Encounter: Payer: Self-pay | Admitting: Student

## 2020-10-13 VITALS — BP 131/72 | HR 100 | Temp 98.8°F | Ht 60.0 in | Wt 159.7 lb

## 2020-10-13 DIAGNOSIS — R5382 Chronic fatigue, unspecified: Secondary | ICD-10-CM | POA: Diagnosis present

## 2020-10-13 DIAGNOSIS — M545 Low back pain, unspecified: Secondary | ICD-10-CM | POA: Diagnosis not present

## 2020-10-13 DIAGNOSIS — R5383 Other fatigue: Secondary | ICD-10-CM

## 2020-10-13 DIAGNOSIS — M549 Dorsalgia, unspecified: Secondary | ICD-10-CM | POA: Insufficient documentation

## 2020-10-13 DIAGNOSIS — Z0000 Encounter for general adult medical examination without abnormal findings: Secondary | ICD-10-CM | POA: Diagnosis not present

## 2020-10-13 NOTE — Progress Notes (Signed)
New Patient Office Visit  Subjective:  Patient ID: Carla Miles, female    DOB: 1985-09-21  Age: 35 y.o. MRN: [id number]  CC:  Chief Complaint  Patient presents with  . Follow-up    Check up .  Back and leg pain     HPI Carla Miles presents with history below presents to establish care and for fatigue.  Past Medical History:  Diagnosis Date  . Gestational diabetes     Past Surgical History:  Procedure Laterality Date  . CESAREAN SECTION  2010    Family History  Problem Relation Age of Onset  . Diabetes Father     Social History   Socioeconomic History  . Marital status: Married    Spouse name: Not on file  . Number of children: Not on file  . Years of education: Not on file  . Highest education level: Not on file  Occupational History  . Not on file  Tobacco Use  . Smoking status: Never Smoker  . Smokeless tobacco: Never Used  Substance and Sexual Activity  . Alcohol use: No  . Drug use: No  . Sexual activity: Yes  Other Topics Concern  . Not on file  Social History Narrative  . Not on file   Social Determinants of Health   Financial Resource Strain: Not on file  Food Insecurity: Not on file  Transportation Needs: Not on file  Physical Activity: Not on file  Stress: Not on file  Social Connections: Not on file  Intimate Partner Violence: Not on file    ROS Review of Systems  Constitutional: Positive for fatigue. Negative for activity change, appetite change and fever.  HENT: Negative for hearing loss, sinus pain and voice change.   Eyes: Negative for visual disturbance.  Respiratory: Negative for cough, chest tightness and shortness of breath.   Cardiovascular: Negative for chest pain and palpitations.  Gastrointestinal: Negative for nausea and vomiting.  Endocrine: Positive for polydipsia. Negative for cold intolerance, heat intolerance and polyuria.  Genitourinary: Negative for dysuria, menstrual problem and pelvic pain.   Musculoskeletal: Positive for back pain. Negative for joint swelling.  Neurological: Negative for dizziness, syncope, weakness and headaches.  Psychiatric/Behavioral: Negative for dysphoric mood. The patient is not nervous/anxious.     Objective:   Today's Vitals: BP 131/72 (BP Location: Left Arm, Patient Position: Sitting, Cuff Size: Normal)   Pulse 100   Temp 98.8 F (37.1 C) (Oral)   Ht 5' (1.524 m)   Wt 159 lb 11.2 oz (72.4 kg)   LMP 09/29/2020   SpO2 100% Comment: room air  BMI 31.19 kg/m   Physical Exam  Assessment & Plan:   Problem List Items Addressed This Visit      Other   Back pain    Patient presents for bilateral back pain for the past 2 months. Notes she has pain after standing or doing house work for long periods. Denies any injury prior to having this back pain. Pain is non radiating and denies numbness, weakness, or fever. On exam today she is not having any back pain and unable to elicit any pain with palpation. Discussed this is likely overuse causing her back pain. Discussed taking frequent breaks when working and avoiding lifting heavy objects. Encourage her to continue with walking and mobilization to prevent stiffness.        Fatigue - Primary   Relevant Orders   Cortisol-am, blood   CBC no Diff   CMP14 + Anion Gap  Hemoglobin A1c   T3   T4   TSH      Outpatient Encounter Medications as of 10/13/2020  Medication Sig  . [DISCONTINUED] oxyCODONE-acetaminophen (PERCOCET/ROXICET) 5-325 MG per tablet Take 1-2 tablets by mouth every 4 (four) hours as needed for severe pain (moderate - severe pain).  . [DISCONTINUED] polyethylene glycol (MIRALAX / GLYCOLAX) packet Take 17 g by mouth daily.  . [DISCONTINUED] Prenatal Vit-Fe Fumarate-FA (PRENATAL VITAMINS PLUS) 27-1 MG TABS Take 1 tablet by mouth daily.   No facility-administered encounter medications on file as of 10/13/2020.    Follow-up: Return in about 4 weeks (around 11/10/2020) for Follow up of  lab work .   Quincy Simmonds, MD   Discussed with Dr. Criselda Peaches.

## 2020-10-13 NOTE — Assessment & Plan Note (Addendum)
Patient reports feeling increased fatigue for the last 1-2 years. Feels that this started around the time she had her third child. But worse in the past year. This is associated with facial hair and hyper pigmented patches on her face and increased thirst. Also feels her hair is thinning and has increased weight recently. States she has regular periods every month without increase in bloating or abdominal pain. Denies fever, chills, cp, DOE, palpitations, heat/cold intolerance, headache, vision change. On exam has hirsutism and scatter areas of hyperpigmentation, no thyromegaly. Differential includes hypopituitarism, hypothyroidism, diabetes, anemia, liver disease, PCOS. Will order future labs so she can have AM cortisol.   Plan Morning cortisol  CBC, CMP TSH, T4, T3 A1c  Follow up after getting labs

## 2020-10-13 NOTE — Patient Instructions (Addendum)
It was a pleasure seeing you in clinic. Today we discussed:   Fatigue: I am ordering lab work to be done. You will need to make a morning lab only appointment to have your cortisol checked.   Back pain: I think this is likely due to overuse and will likely improve with time and rest.   Please make a lab appointment (will need first appointment of the day) for blood work Follow up after getting lab work   If you have any questions or concerns, please call our clinic at [phone] between 9am-5pm and after hours call [phone] and ask for the internal medicine resident on call. If you feel you are having a medical emergency please call 911.   Thank you, we look forward to helping you remain healthy!

## 2020-10-13 NOTE — Assessment & Plan Note (Signed)
Discussed need to get Covid booster which she and husband are planning on getting soon. Up to date on flu shot.  Up to date on PAP smear from 03/2020 with negative cytology and HPV testing

## 2020-10-13 NOTE — Assessment & Plan Note (Signed)
Patient presents for bilateral back pain for the past 2 months. Notes she has pain after standing or doing house work for long periods. Denies any injury prior to having this back pain. Pain is non radiating and denies numbness, weakness, or fever. On exam today she is not having any back pain and unable to elicit any pain with palpation. Discussed this is likely overuse causing her back pain. Discussed taking frequent breaks when working and avoiding lifting heavy objects. Encourage her to continue with walking and mobilization to prevent stiffness.

## 2020-10-15 ENCOUNTER — Other Ambulatory Visit (INDEPENDENT_AMBULATORY_CARE_PROVIDER_SITE_OTHER): Payer: Medicaid Other

## 2020-10-15 ENCOUNTER — Other Ambulatory Visit: Payer: Self-pay

## 2020-10-15 DIAGNOSIS — R5383 Other fatigue: Secondary | ICD-10-CM | POA: Diagnosis present

## 2020-10-15 NOTE — Progress Notes (Signed)
Internal Medicine Clinic Attending ? ?Case discussed with Dr. Liang  At the time of the visit.  We reviewed the resident?s history and exam and pertinent patient test results.  I agree with the assessment, diagnosis, and plan of care documented in the resident?s note. ? ?

## 2020-10-16 LAB — CMP14 + ANION GAP
ALT: 14 IU/L (ref 0–32)
AST: 11 IU/L (ref 0–40)
Albumin/Globulin Ratio: 1.7 (ref 1.2–2.2)
Albumin: 4.5 g/dL (ref 3.8–4.8)
Alkaline Phosphatase: 51 IU/L (ref 44–121)
Anion Gap: 14 mmol/L (ref 10.0–18.0)
BUN/Creatinine Ratio: 18 (ref 9–23)
BUN: 12 mg/dL (ref 6–20)
Bilirubin Total: <0.2 mg/dL (ref 0.0–1.2)
CO2: 24 mmol/L (ref 20–29)
Calcium: 9.7 mg/dL (ref 8.7–10.2)
Chloride: 100 mmol/L (ref 96–106)
Creatinine, Ser: 0.68 mg/dL (ref 0.57–1.00)
GFR calc Af Amer: 130 mL/min/{1.73_m2} (ref >59)
GFR calc non Af Amer: 113 mL/min/{1.73_m2} (ref >59)
Globulin, Total: 2.7 g/dL (ref 1.5–4.5)
Glucose: 102 mg/dL — ABNORMAL HIGH (ref 65–99)
Potassium: 4.6 mmol/L (ref 3.5–5.2)
Sodium: 138 mmol/L (ref 134–144)
Total Protein: 7.2 g/dL (ref 6.0–8.5)

## 2020-10-16 LAB — TSH: TSH: 2.52 u[IU]/mL (ref 0.450–4.500)

## 2020-10-16 LAB — CBC
Hematocrit: 40.8 % (ref 34.0–46.6)
Hemoglobin: 13.5 g/dL (ref 11.1–15.9)
MCH: 27.8 pg (ref 26.6–33.0)
MCHC: 33.1 g/dL (ref 31.5–35.7)
MCV: 84 fL (ref 79–97)
Platelets: 273 10*3/uL (ref 150–450)
RBC: 4.85 x10E6/uL (ref 3.77–5.28)
RDW: 12.1 % (ref 11.7–15.4)
WBC: 5.8 10*3/uL (ref 3.4–10.8)

## 2020-10-16 LAB — T3: T3, Total: 128 ng/dL (ref 71–180)

## 2020-10-16 LAB — HEMOGLOBIN A1C
Est. average glucose Bld gHb Est-mCnc: 126 mg/dL
Hgb A1c MFr Bld: 6 % — ABNORMAL HIGH (ref 4.8–5.6)

## 2020-10-16 LAB — CORTISOL-AM, BLOOD: Cortisol - AM: 11.9 ug/dL (ref 6.2–19.4)

## 2020-10-16 LAB — T4: T4, Total: 5.8 ug/dL (ref 4.5–12.0)

## 2020-10-21 ENCOUNTER — Other Ambulatory Visit: Payer: Self-pay | Admitting: Student

## 2020-10-21 ENCOUNTER — Telehealth: Payer: Self-pay

## 2020-10-21 DIAGNOSIS — R5382 Chronic fatigue, unspecified: Secondary | ICD-10-CM

## 2020-10-21 NOTE — Telephone Encounter (Signed)
Requesting lab results, please call pt back.  

## 2020-10-22 NOTE — Telephone Encounter (Signed)
Called to discussed lab results. All questions answered discussed PCOS may be causing her symptoms. I have ordered a transvaginal US to evaluate for PCOS for this . She should follow up in clinic after this is done to discuss results and further management.

## 2020-10-22 NOTE — Telephone Encounter (Signed)
Forwarded to ordering provider

## 2020-12-02 ENCOUNTER — Other Ambulatory Visit: Payer: Medicaid Other

## 2020-12-09 ENCOUNTER — Telehealth: Payer: Self-pay | Admitting: Internal Medicine

## 2020-12-09 DIAGNOSIS — L819 Disorder of pigmentation, unspecified: Secondary | ICD-10-CM

## 2020-12-09 NOTE — Telephone Encounter (Signed)
  [facility] Internal Medicine Residency Telephone Encounter Continuity Care Appointment  HPI:   This telephone encounter was created for [name] on 12/09/2020 for the following purpose/cc hyperpigmentation.    Past Medical History:  Past Medical History:  Diagnosis Date  . Gestational diabetes       ROS:   Negative except as stated in HPI.   Assessment / Plan / Recommendations:   Please see A&P under problem oriented charting for assessment of the patient's acute and chronic medical conditions.   As always, pt is advised that if symptoms worsen or new symptoms arise, they should go to an urgent care facility or to to ER for further evaluation.   Consent and Medical Decision Making:   Patient discussed with Dr. Mayford Knife  This is a telephone encounter between Carla Miles and Carla Miles on 12/09/2020 for hyperpigmentation. The visit was conducted with the patient located at home and Carla Miles at [facility]. The patient's identity was confirmed using their DOB and current address. The patient has consented to being evaluated through a telephone encounter and understands the associated risks (an examination cannot be done and the patient may need to come in for an appointment) / benefits (allows the patient to remain at home, decreasing exposure to coronavirus). I personally spent 10 minutes on medical discussion.

## 2020-12-09 NOTE — Assessment & Plan Note (Signed)
Received call from patient requesting dermatology referral. She was evaluated on 10/14/2019 for fatigue and back pain. At the time, patient endorsed increasing fatigue for several years associated with facial hair, hyperpigmentation on face with weight gain. She was noted to have hirsutism and scattered areas of hyperpigmentation without thyromegaly on exam. Labs at the time including thyroid panel, AM cortisol, CMP, CBC w/diff and HbA1c were within normal limits except increased A1c to 6.0% consistent with prediabetes. [facility] ordered to evaluate for PCOS which patient has not scheduled yet.  Today, patient reports concerns with worsening hyperpigmentation. She endorses 5lb intentional weight loss since her appointment due to dietary changes and increased physical activity. Notes some mild ongoing back pain but improved from prior. She is concerned about her worsening facial hyperpigmentation. She denies any itching or rash associated with the hyperpigmentation. She notes that she has a skin care regimen in which she is using niacinamide and moisturizer and night and vitamin C serum with moisturizer during the day. However, notes that her hyperpigmentation has progressed and is more prominent, especially around the temporal region and on her cheeks.   I advised patient to also incorporate sun screen as this may be contributing. I also advised her to follow up with the [facility] to evaluate for PCOS. She reports that she had this approximately one year prior with her OB/GYN when she had IUD insertion. She denies any abnormal findings at the time.   Plan:  Obtain records from patient's OB/GYN In absence of systemic findings to explain her hyperpigmentation, will place referral to dermatology Continue to monitor for progression of symptoms
